# Patient Record
Sex: Female | Born: 1990 | Race: Black or African American | Hispanic: No | Marital: Single | Smoking: Never smoker
Health system: Southern US, Community
[De-identification: ages and names within clinical notes are randomized; demographics above are authoritative.]

## PROBLEM LIST (undated history)

## (undated) DIAGNOSIS — R569 Unspecified convulsions: Secondary | ICD-10-CM

## (undated) DIAGNOSIS — E785 Hyperlipidemia, unspecified: Secondary | ICD-10-CM

## (undated) DIAGNOSIS — E7800 Pure hypercholesterolemia, unspecified: Secondary | ICD-10-CM

## (undated) HISTORY — PX: WISDOM TOOTH EXTRACTION: SHX21

## (undated) HISTORY — DX: Unspecified convulsions: R56.9

## (undated) HISTORY — DX: Hyperlipidemia, unspecified: E78.5

## (undated) HISTORY — DX: Pure hypercholesterolemia, unspecified: E78.00

---

## 2013-04-20 ENCOUNTER — Inpatient Hospital Stay: Payer: Self-pay | Admitting: Certified Nurse Midwife

## 2013-04-21 LAB — CBC WITH DIFFERENTIAL/PLATELET
BASOS PCT: 0.3 %
Basophil #: 0.1 10*3/uL (ref 0.0–0.1)
Eosinophil #: 0 10*3/uL (ref 0.0–0.7)
Eosinophil %: 0 %
HCT: 38.9 % (ref 35.0–47.0)
HGB: 12.7 g/dL (ref 12.0–16.0)
LYMPHS PCT: 2.6 %
Lymphocyte #: 0.8 10*3/uL — ABNORMAL LOW (ref 1.0–3.6)
MCH: 28.3 pg (ref 26.0–34.0)
MCHC: 32.7 g/dL (ref 32.0–36.0)
MCV: 87 fL (ref 80–100)
Monocyte #: 1.6 x10 3/mm — ABNORMAL HIGH (ref 0.2–0.9)
Monocyte %: 5 %
Neutrophil #: 29.3 10*3/uL — ABNORMAL HIGH (ref 1.4–6.5)
Neutrophil %: 92.1 %
Platelet: 272 10*3/uL (ref 150–440)
RBC: 4.5 10*6/uL (ref 3.80–5.20)
RDW: 14 % (ref 11.5–14.5)
WBC: 31.8 10*3/uL — ABNORMAL HIGH (ref 3.6–11.0)

## 2013-04-21 LAB — GC/CHLAMYDIA PROBE AMP

## 2013-04-22 LAB — HEMATOCRIT: HCT: 32.4 % — AB (ref 35.0–47.0)

## 2013-11-23 DIAGNOSIS — M419 Scoliosis, unspecified: Secondary | ICD-10-CM | POA: Insufficient documentation

## 2013-12-24 ENCOUNTER — Emergency Department: Payer: Self-pay | Admitting: Student

## 2014-06-05 NOTE — H&P (Signed)
L&D Evaluation:  History:  HPI 24 yo G1 at 7649w3d by D=11wk US derived EDC of 04/18/13 presenting with contractions.  2/80/0 today in clinic.  No LOF, no VB, +FM   Presents with contractions   Patient's Medical History No Chronic Illness   Patient's Surgical History none   Medications Pre Natal Vitamins   Allergies NKDA   Social History none   Family History Non-Contributory   ROS:  ROS All systems were reviewed.  HEENT, CNS, GI, GU, Respiratory, CV, Renal and Musculoskeletal systems were found to be normal.   Exam:  Vital Signs stable   Urine Protein not completed   General no apparent distress   Mental Status clear   Chest clear   Abdomen gravid, non-tender   Estimated Fetal Weight Average for gestational age   Fetal Position vtx   Edema no edema   Pelvic no external lesions, 3/80/0   Mebranes Intact   FHT normal rate with no decels, category I tracing   Ucx regular, q705min   Impression:  Impression early labor, vs Braxton Hicks contraction at 6155w2d   Plan:  Plan EFM/NST, monitor contractions and for cervical change   Comments 1) Labor vs braxton-hicks contractions      - morphine 10mg  IM once     - prolonged rule out  2) Fetus - category I tracing     - AFI 9.54cm on 12/26  3) PNL A negative / ABSC negative / RI / VZI / HIV neg / RPR NR / HBsAg neg / CF negative / 1st trimester screen negative / 1-hr OGTT 85 / GBS negative     - s/p rhogam at 01/17/13  4) TDAP given 02/15/13  5) Disposition - pending prolonged rule out   Electronic Signatures: Lorrene ReidStaebler, [place] Yeats M (MD)  (Signed 27-Mar-15 00:39)  Authored: L&D Evaluation   Last Updated: 27-Mar-15 00:39 by Lorrene ReidStaebler, Breannah Kratt M (MD)

## 2019-04-21 ENCOUNTER — Ambulatory Visit: Payer: Self-pay | Attending: Internal Medicine

## 2019-04-21 DIAGNOSIS — Z23 Encounter for immunization: Secondary | ICD-10-CM

## 2019-04-21 NOTE — Progress Notes (Signed)
   Covid-19 Vaccination Clinic  Name:  Gina Henry    MRN: [id number] DOB: [birth year]  04/21/2019  [name] was observed post Covid-19 immunization for 15 minutes without incident. She was provided with Vaccine Information Sheet and instruction to access the V-Safe system.   [name] was instructed to call 911 with any severe reactions post vaccine: Marland Kitchen Difficulty breathing  . Swelling of face and throat  . A fast heartbeat  . A bad rash all over body  . Dizziness and weakness   Immunizations Administered    Name Date Dose VIS Date Route   Pfizer COVID-19 Vaccine 04/21/2019  5:16 PM 0.3 mL 01/06/2019 Intramuscular   Manufacturer: [employer], [employer]   Lot: [id number]   NDC: [address]-5

## 2019-05-02 ENCOUNTER — Encounter: Payer: Self-pay | Admitting: Obstetrics and Gynecology

## 2019-05-13 ENCOUNTER — Ambulatory Visit: Payer: Self-pay | Attending: Internal Medicine

## 2019-05-13 DIAGNOSIS — Z23 Encounter for immunization: Secondary | ICD-10-CM

## 2019-05-13 NOTE — Progress Notes (Signed)
   Covid-19 Vaccination Clinic  Name:  Gina Henry    MRN: [id number] DOB: 1990/05/14  05/13/2019  Gina Henry was observed post Covid-19 immunization for 15 minutes without incident. She was provided with Vaccine Information Sheet and instruction to access the V-Safe system.   Gina Henry was instructed to call 911 with any severe reactions post vaccine: Marland Kitchen Difficulty breathing  . Swelling of face and throat  . A fast heartbeat  . A bad rash all over body  . Dizziness and weakness   Immunizations Administered    Name Date Dose VIS Date Route   Pfizer COVID-19 Vaccine 05/13/2019  3:36 PM 0.3 mL 01/06/2019 Intramuscular   Manufacturer: [employer], [employer]   Lot: [id number]   NDC: [address]-2

## 2020-07-12 DIAGNOSIS — I868 Varicose veins of other specified sites: Secondary | ICD-10-CM | POA: Insufficient documentation

## 2020-08-09 ENCOUNTER — Encounter (INDEPENDENT_AMBULATORY_CARE_PROVIDER_SITE_OTHER): Payer: Self-pay | Admitting: Nurse Practitioner

## 2020-08-09 ENCOUNTER — Ambulatory Visit (INDEPENDENT_AMBULATORY_CARE_PROVIDER_SITE_OTHER): Payer: BC Managed Care – PPO | Admitting: Nurse Practitioner

## 2020-08-09 ENCOUNTER — Other Ambulatory Visit: Payer: Self-pay

## 2020-08-09 VITALS — BP 111/75 | HR 87 | Resp 16 | Ht 66.5 in | Wt 148.6 lb

## 2020-08-09 DIAGNOSIS — I8312 Varicose veins of left lower extremity with inflammation: Secondary | ICD-10-CM

## 2020-08-09 DIAGNOSIS — I8311 Varicose veins of right lower extremity with inflammation: Secondary | ICD-10-CM

## 2020-08-19 ENCOUNTER — Encounter (INDEPENDENT_AMBULATORY_CARE_PROVIDER_SITE_OTHER): Payer: Self-pay | Admitting: Nurse Practitioner

## 2020-08-19 NOTE — Progress Notes (Signed)
Subjective:    Patient ID: Gina Henry, female    DOB: 06-13-1990, 30 y.o.   MRN: [id number] Chief Complaint  Patient presents with   New Patient (Initial Visit)    Ref Karma Greaser eval for VV in upper thigh    The patient is seen for evaluation of varicose veins with inflammation.  She is concerned due to the increasing appearance of them and she has a family history of significant varicose veins.  Varicosities occur mostly at her bilateral thighs.  There is no history of DVT, PE or superficial thrombophlebitis. There is no history of ulceration or hemorrhage. The patient endorses a significant family history of varicose veins.   The patient has not worn graduated compression in the past. At the present time the patient has not been using over-the-counter analgesics. There is no history of prior surgical intervention or sclerotherapy.      Review of Systems  Cardiovascular:  Negative for leg swelling.  All other systems reviewed and are negative.     Objective:   Physical Exam Vitals reviewed.  HENT:     Head: Normocephalic.  Cardiovascular:     Rate and Rhythm: Normal rate.     Pulses: Normal pulses.  Pulmonary:     Effort: Pulmonary effort is normal.  Skin:    General: Skin is warm and dry.  Neurological:     Mental Status: She is alert and oriented to person, place, and time.  Psychiatric:        Mood and Affect: Mood normal.        Behavior: Behavior normal.        Thought Content: Thought content normal.        Judgment: Judgment normal.    BP 111/75 (BP Location: Right Arm)   Pulse 87   Resp 16   Ht 5' 6.5" (1.689 m)   Wt 148 lb 9.6 oz (67.4 kg)   BMI 23.63 kg/m   Past Medical History:  Diagnosis Date   Hyperlipidemia     Social History   Socioeconomic History   Marital status: Single    Spouse name: Not on file   Number of children: Not on file   Years of education: Not on file   Highest education level: Not on file  Occupational History    Not on file  Tobacco Use   Smoking status: Never   Smokeless tobacco: Never  Vaping Use   Vaping Use: Never used  Substance and Sexual Activity   Alcohol use: Yes    Comment: occassionally   Drug use: Never   Sexual activity: Not on file  Other Topics Concern   Not on file  Social History Narrative   Not on file   Social Determinants of Health   Financial Resource Strain: Not on file  Food Insecurity: Not on file  Transportation Needs: Not on file  Physical Activity: Not on file  Stress: Not on file  Social Connections: Not on file  Intimate Partner Violence: Not on file    History reviewed. No pertinent surgical history.  Family History  Problem Relation Age of Onset   Varicose Veins Mother    Colon cancer Father     No Known Allergies  CBC Latest Ref Rng & Units 04/22/2013 04/21/2013  WBC 3.6 - 11.0 x10 3/mm 3 - 31.8(H)  Hemoglobin 12.0 - 16.0 g/dL - 48.1  Hematocrit 85.6 - 47.0 % 32.4(L) 38.9  Platelets 150 - 440 x10 3/mm 3 - 272  CMP  No results found for: NA, K, CL, CO2, GLUCOSE, BUN, CREATININE, CALCIUM, PROT, ALBUMIN, AST, ALT, ALKPHOS, BILITOT, GFRNONAA, GFRAA   No results found.     Assessment & Plan:   1. Varicose veins of both lower extremities with inflammation  Recommend:  The patient has large symptomatic varicose veins that are painful and associated with swelling.  I have had a long discussion with the patient regarding  varicose veins and why they cause symptoms.  Patient will begin wearing graduated compression stockings class 1 on a daily basis, beginning first thing in the morning and removing them in the evening. The patient is instructed specifically not to sleep in the stockings.    The patient  will also begin using over-the-counter analgesics such as Motrin 600 mg po TID to help control the symptoms.    In addition, behavioral modification including elevation during the day will be initiated.    An  ultrasound of the  venous system will be obtained.   Further plans will be based on the ultrasound results and whether conservative therapies are successful at eliminating the pain and swelling.     No current outpatient medications on file prior to visit.   No current facility-administered medications on file prior to visit.    There are no Patient Instructions on file for this visit. No follow-ups on file.   Georgiana Spinner, NP

## 2020-08-23 ENCOUNTER — Encounter (INDEPENDENT_AMBULATORY_CARE_PROVIDER_SITE_OTHER): Payer: BC Managed Care – PPO

## 2020-08-23 ENCOUNTER — Ambulatory Visit (INDEPENDENT_AMBULATORY_CARE_PROVIDER_SITE_OTHER): Payer: BC Managed Care – PPO | Admitting: Nurse Practitioner

## 2021-06-29 ENCOUNTER — Ambulatory Visit
Admission: EM | Admit: 2021-06-29 | Discharge: 2021-06-29 | Disposition: A | Discharge status: Home / Self Care | Payer: BC Managed Care – PPO | Attending: Emergency Medicine | Admitting: Emergency Medicine

## 2021-06-29 ENCOUNTER — Encounter: Payer: Self-pay | Admitting: Emergency Medicine

## 2021-06-29 DIAGNOSIS — J020 Streptococcal pharyngitis: Secondary | ICD-10-CM

## 2021-06-29 LAB — POCT RAPID STREP A (OFFICE): Rapid Strep A Screen: POSITIVE — AB

## 2021-06-29 MED ORDER — AMOXICILLIN 500 MG PO CAPS: 500.0000 mg | ORAL_CAPSULE | Freq: Two times a day (BID) | ORAL | 0 refills | Status: AC

## 2021-06-29 NOTE — ED Triage Notes (Signed)
Pt said sore throat x 1 day with while inflammation on tonsils. Painful to swallow.

## 2021-06-29 NOTE — Discharge Instructions (Addendum)
Take the amoxicillin as directed.  Follow up with your primary care provider if your symptoms are not improving.   ° ° °

## 2021-06-29 NOTE — ED Provider Notes (Signed)
Renal[facility] Fiddler    CSN: [id number] Arrival date & time: 06/29/21  1117      History   Chief Complaint Chief Complaint  Patient presents with   Sore Throat    HPI Gina Henry is a 31 y.o. female.  Patient presents with sore throat since yesterday.  No fever, chills, rash, difficulty swallowing, cough, shortness of breath, or other symptoms.  Treatment at home with Tylenol; last taken yesterday.  No pertinent medical history.  The history is provided by the patient.   Past Medical History:  Diagnosis Date   Hyperlipidemia     There are no problems to display for this patient.   History reviewed. No pertinent surgical history.  OB History   No obstetric history on file.      Home Medications    Prior to Admission medications   Medication Sig Start Date End Date Taking? Authorizing Provider  amoxicillin (AMOXIL) 500 MG capsule Take 1 capsule (500 mg total) by mouth 2 (two) times daily for 10 days. 06/29/21 07/09/21 Yes Mickie Bail, NP    Family History Family History  Problem Relation Age of Onset   Varicose Veins Mother    Colon cancer Father     Social History Social History   Tobacco Use   Smoking status: Never   Smokeless tobacco: Never  Vaping Use   Vaping Use: Never used  Substance Use Topics   Alcohol use: Yes    Comment: occassionally   Drug use: Never     Allergies   Patient has no known allergies.   Review of Systems Review of Systems  Constitutional:  Negative for chills and fever.  HENT:  Positive for sore throat. Negative for ear pain and trouble swallowing.   Respiratory:  Negative for cough and shortness of breath.   Gastrointestinal:  Negative for diarrhea and vomiting.  Skin:  Negative for color change and rash.  All other systems reviewed and are negative.   Physical Exam Triage Vital Signs ED Triage Vitals  Enc Vitals Group     BP      Pulse      Resp      Temp      Temp src      SpO2      Weight       Height      Head Circumference      Peak Flow      Pain Score      Pain Loc      Pain Edu?      Excl. in GC?    No data found.  Updated Vital Signs BP 101/69 (BP Location: Right Arm)   Pulse 90   Temp 99.2 F (37.3 C) (Oral)   Resp 16   LMP 06/12/2021   SpO2 96%   Visual Acuity Right Eye Distance:   Left Eye Distance:   Bilateral Distance:    Right Eye Near:   Left Eye Near:    Bilateral Near:     Physical Exam Vitals and nursing note reviewed.  Constitutional:      General: She is not in acute distress.    Appearance: Normal appearance. She is well-developed. She is not ill-appearing.  HENT:     Right Ear: Tympanic membrane normal.     Left Ear: Tympanic membrane normal.     Nose: Nose normal.     Mouth/Throat:     Mouth: Mucous membranes are moist.  Pharynx: Posterior oropharyngeal erythema present.  Cardiovascular:     Rate and Rhythm: Normal rate and regular rhythm.     Heart sounds: Normal heart sounds.  Pulmonary:     Effort: Pulmonary effort is normal. No respiratory distress.     Breath sounds: Normal breath sounds.  Musculoskeletal:     Cervical back: Neck supple.  Skin:    General: Skin is warm and dry.  Neurological:     Mental Status: She is alert.  Psychiatric:        Mood and Affect: Mood normal.        Behavior: Behavior normal.     UC Treatments / Results  Labs (all labs ordered are listed, but only abnormal results are displayed) Labs Reviewed  POCT RAPID STREP A (OFFICE) - Abnormal; Notable for the following components:      Result Value   Rapid Strep A Screen Positive (*)    All other components within normal limits    EKG   Radiology No results found.  Procedures Procedures (including critical care time)  Medications Ordered in UC Medications - No data to display  Initial Impression / Assessment and Plan / UC Course  I have reviewed the triage vital signs and the nursing notes.  Pertinent labs & imaging results  that were available during my care of the patient were reviewed by me and considered in my medical decision making (see chart for details).    Strep pharyngitis.  Rapid strep positive.  Treating with amoxicillin.  Discussed Tylenol or ibuprofen as needed.  Education provided on strep throat.  Instructed patient to follow up with her PCP if her symptoms are not improving.  She agrees to plan of care.    Final Clinical Impressions(s) / UC Diagnoses   Final diagnoses:  Strep pharyngitis     Discharge Instructions      Take the amoxicillin as directed.  Follow up with your primary care provider if your symptoms are not improving.        ED Prescriptions     Medication Sig Dispense Auth. Provider   amoxicillin (AMOXIL) 500 MG capsule Take 1 capsule (500 mg total) by mouth 2 (two) times daily for 10 days. 20 capsule Mickie Bail, NP      PDMP not reviewed this encounter.   Mickie Bail, NP 06/29/21 1200

## 2021-07-10 ENCOUNTER — Ambulatory Visit: Payer: Self-pay | Admitting: Nurse Practitioner

## 2021-07-10 ENCOUNTER — Encounter: Payer: Self-pay | Admitting: Nurse Practitioner

## 2021-07-10 DIAGNOSIS — Z113 Encounter for screening for infections with a predominantly sexual mode of transmission: Secondary | ICD-10-CM

## 2021-07-10 LAB — WET PREP FOR TRICH, YEAST, CLUE
Trichomonas Exam: NEGATIVE
Yeast Exam: NEGATIVE

## 2021-07-10 LAB — HM HIV SCREENING LAB: HM HIV Screening: NEGATIVE

## 2021-07-10 NOTE — Progress Notes (Signed)
WET PREP negative. No treatment per SO. Verbalized understanding of further labwork pending.  Lethea Killings RN

## 2021-07-10 NOTE — Progress Notes (Signed)
[facility] Department  STI clinic/screening visit [address] [place] [address] [phone]  Subjective:  Gina Henry is a 31 y.o. female being seen today for an STI screening visit. The patient reports they do have symptoms.  Patient reports that they do not desire a pregnancy in the next year.   They reported they are not interested in discussing contraception today.    Patient's last menstrual period was 07/10/2021 (exact date).   Patient has the following medical conditions:  There are no problems to display for this patient.   Chief Complaint  Patient presents with   SEXUALLY TRANSMITTED DISEASE    STI screening. "I think I have a yeast infection."     HPI  Patient reports to clinic today for STD screening.  Patient states that she recently completed Amoxicillin on yesterday 07/09/21 for a Strep throat.    Last HIV test per patient/review of record was: Unsure  Patient reports last pap was: One year ago   Screening for MPX risk: Does the patient have an unexplained rash? No Is the patient MSM? No Does the patient endorse multiple sex partners or anonymous sex partners? No Did the patient have close or sexual contact with a person diagnosed with MPX? No Has the patient traveled outside the [place] where MPX is endemic? No Is there a high clinical suspicion for MPX-- evidenced by one of the following No  -Unlikely to be chickenpox  -Lymphadenopathy  -Rash that present in same phase of evolution on any given body part See flowsheet for further details and programmatic requirements.   Immunization history:  Immunization History  Administered Date(s) Administered   Hepatitis A 02/23/2007, 03/30/2008   Hepatitis B [birth year], 1991, 12/29/1991, 10/23/1992   Hpv-Unspecified 02/23/2007, 04/26/2007, 08/24/2007   PFIZER(Purple Top)SARS-COV-2 Vaccination 04/21/2019, 05/13/2019   Tdap 05/02/2009     The following portions of the patient's history were  reviewed and updated as appropriate: allergies, current medications, past medical history, past social history, past surgical history and problem list.  Objective:  There were no vitals filed for this visit.  Physical Exam Constitutional:      Appearance: Normal appearance.  HENT:     Head: Normocephalic. No abrasion, masses or laceration. Hair is normal.     Mouth/Throat:     Lips: Pink.     Mouth: Mucous membranes are moist. No oral lesions.     Dentition: No dental caries.     Pharynx: No oropharyngeal exudate or posterior oropharyngeal erythema.     Tonsils: No tonsillar exudate or tonsillar abscesses.  Eyes:     General: Lids are normal.        Right eye: No discharge.        Left eye: No discharge.     Conjunctiva/sclera: Conjunctivae normal.     Right eye: No exudate.    Left eye: No exudate. Abdominal:     General: Abdomen is flat.     Palpations: Abdomen is soft.     Tenderness: There is no abdominal tenderness. There is no rebound.  Genitourinary:    Pubic Area: No rash or pubic lice.      Labia:        Right: No rash, tenderness, lesion or injury.        Left: No rash, tenderness, lesion or injury.      Vagina: Normal. No vaginal discharge, erythema or lesions.     Cervix: No cervical motion tenderness, discharge, lesion or erythema.  Uterus: Not enlarged and not tender.      Rectum: External hemorrhoid present.     Comments: Small superficial tear/laceration to rectum.    Amount Discharge: small  Odor: No pH: greater than 4.5 Adheres to vaginal wall: No Color: clear Musculoskeletal:     Cervical back: Full passive range of motion without pain, normal range of motion and neck supple.  Lymphadenopathy:     Cervical: No cervical adenopathy.     Right cervical: No superficial, deep or posterior cervical adenopathy.    Left cervical: No superficial, deep or posterior cervical adenopathy.     Upper Body:     Right upper body: No supraclavicular, axillary or  epitrochlear adenopathy.     Left upper body: No supraclavicular, axillary or epitrochlear adenopathy.     Lower Body: No right inguinal adenopathy. No left inguinal adenopathy.  Skin:    General: Skin is warm and dry.     Findings: No lesion or rash.  Neurological:     Mental Status: She is alert and oriented to person, place, and time.  Psychiatric:        Attention and Perception: Attention normal.        Mood and Affect: Mood normal.        Speech: Speech normal.        Behavior: Behavior is cooperative.      Assessment and Plan:  Gina Henry is a 31 y.o. female presenting to the [facility] Department for STI screening  1. Screening examination for venereal disease -31 year old female in clinic today for STD screening. -Patient accepted all screenings including oral, vaginal CT/GC, wet prep, and bloodwork for HIV/RPR.  Patient meets criteria for HepB screening? Yes. Ordered? No - refused Patient meets criteria for HepC screening? Yes. Ordered? No - refused  Treat wet prep per standing order Discussed time line for [facility] results and that patient will be called with positive results and encouraged patient to call if she had not heard in 2 weeks.  Counseled to return or seek care for continued or worsening symptoms Recommended condom use with all sex  Patient is currently not using  contraception  to prevent pregnancy.    - HIV [facility] - Syphilis Serology, [place] - Chlamydia/Gonorrhea [facility] - Chlamydia/Gonorrhea [facility] - WET PREP FOR TRICH, YEAST, CLUE     Return if symptoms worsen or fail to improve.    Glenna Fellows, FNP

## 2021-07-11 ENCOUNTER — Emergency Department (HOSPITAL_COMMUNITY): Payer: BC Managed Care – PPO

## 2021-07-11 ENCOUNTER — Emergency Department (HOSPITAL_COMMUNITY)
Admission: EM | Admit: 2021-07-11 | Discharge: 2021-07-11 | Disposition: A | Discharge status: Home / Self Care | Payer: BC Managed Care – PPO | Attending: Emergency Medicine | Admitting: Emergency Medicine

## 2021-07-11 ENCOUNTER — Other Ambulatory Visit: Payer: Self-pay

## 2021-07-11 ENCOUNTER — Encounter (HOSPITAL_COMMUNITY): Payer: Self-pay

## 2021-07-11 DIAGNOSIS — D72829 Elevated white blood cell count, unspecified: Secondary | ICD-10-CM | POA: Diagnosis not present

## 2021-07-11 DIAGNOSIS — Z23 Encounter for immunization: Secondary | ICD-10-CM | POA: Diagnosis not present

## 2021-07-11 DIAGNOSIS — R569 Unspecified convulsions: Secondary | ICD-10-CM

## 2021-07-11 DIAGNOSIS — X58XXXA Exposure to other specified factors, initial encounter: Secondary | ICD-10-CM | POA: Insufficient documentation

## 2021-07-11 DIAGNOSIS — S90811A Abrasion, right foot, initial encounter: Secondary | ICD-10-CM | POA: Insufficient documentation

## 2021-07-11 DIAGNOSIS — E876 Hypokalemia: Secondary | ICD-10-CM | POA: Insufficient documentation

## 2021-07-11 LAB — BASIC METABOLIC PANEL
Anion gap: 11 (ref 5–15)
BUN: 10 mg/dL (ref 6–20)
CO2: 17 mmol/L — ABNORMAL LOW (ref 22–32)
Calcium: 8.6 mg/dL — ABNORMAL LOW (ref 8.9–10.3)
Chloride: 107 mmol/L (ref 98–111)
Creatinine, Ser: 1.05 mg/dL — ABNORMAL HIGH (ref 0.44–1.00)
GFR, Estimated: >60 mL/min (ref >60)
Glucose, Bld: 99 mg/dL (ref 70–99)
Potassium: 3.4 mmol/L — ABNORMAL LOW (ref 3.5–5.1)
Sodium: 135 mmol/L (ref 135–145)

## 2021-07-11 LAB — CBC
HCT: 34 % — ABNORMAL LOW (ref 36.0–46.0)
Hemoglobin: 10.8 g/dL — ABNORMAL LOW (ref 12.0–15.0)
MCH: 27.8 pg (ref 26.0–34.0)
MCHC: 31.8 g/dL (ref 30.0–36.0)
MCV: 87.4 fL (ref 80.0–100.0)
Platelets: 430 10*3/uL — ABNORMAL HIGH (ref 150–400)
RBC: 3.89 MIL/uL (ref 3.87–5.11)
RDW: 14 % (ref 11.5–15.5)
WBC: 17.1 10*3/uL — ABNORMAL HIGH (ref 4.0–10.5)
nRBC: 0 % (ref 0.0–0.2)

## 2021-07-11 LAB — URINALYSIS, ROUTINE W REFLEX MICROSCOPIC
Bacteria, UA: NONE SEEN
Bilirubin Urine: NEGATIVE
Glucose, UA: NEGATIVE mg/dL
Ketones, ur: NEGATIVE mg/dL
Leukocytes,Ua: NEGATIVE
Nitrite: NEGATIVE
Protein, ur: NEGATIVE mg/dL
Specific Gravity, Urine: 1.006 (ref 1.005–1.030)
pH: 5 (ref 5.0–8.0)

## 2021-07-11 LAB — RAPID URINE DRUG SCREEN, HOSP PERFORMED
Amphetamines: NOT DETECTED
Barbiturates: NOT DETECTED
Benzodiazepines: NOT DETECTED
Cocaine: NOT DETECTED
Opiates: NOT DETECTED
Tetrahydrocannabinol: NOT DETECTED

## 2021-07-11 LAB — I-STAT BETA HCG BLOOD, ED (MC, WL, AP ONLY): I-stat hCG, quantitative: <5 m[IU]/mL (ref <5)

## 2021-07-11 MED ORDER — BACITRACIN ZINC 500 UNIT/GM EX OINT
TOPICAL_OINTMENT | Freq: Two times a day (BID) | CUTANEOUS | Status: DC
  Administered 2021-07-11: 1 via TOPICAL
  Filled 2021-07-11: qty 0.9

## 2021-07-11 MED ORDER — ACETAMINOPHEN 500 MG PO TABS
1000.0000 mg | ORAL_TABLET | ORAL | Status: AC
  Administered 2021-07-11: 1000 mg via ORAL
  Filled 2021-07-11: qty 2

## 2021-07-11 MED ORDER — SODIUM CHLORIDE 0.9 % IV BOLUS
1000.0000 mL | Freq: Once | INTRAVENOUS | Status: AC
Start: 2021-07-11 — End: 2021-07-11
  Administered 2021-07-11: 1000 mL via INTRAVENOUS

## 2021-07-11 MED ORDER — TETANUS-DIPHTH-ACELL PERTUSSIS 5-2.5-18.5 LF-MCG/0.5 IM SUSY
0.5000 mL | PREFILLED_SYRINGE | Freq: Once | INTRAMUSCULAR | Status: AC
  Administered 2021-07-11: 0.5 mL via INTRAMUSCULAR
  Filled 2021-07-11: qty 0.5

## 2021-07-11 MED ORDER — POTASSIUM CHLORIDE CRYS ER 20 MEQ PO TBCR
40.0000 meq | EXTENDED_RELEASE_TABLET | Freq: Once | ORAL | Status: AC
  Administered 2021-07-11: 40 meq via ORAL
  Filled 2021-07-11: qty 2

## 2021-07-11 NOTE — Discharge Instructions (Signed)
I have given you the information for 2 neurology clinics you can call Monday to make an appointment with 1 of these.  Please return immediately to the emergency room should he experience another episode of the symptoms yet today.  As we discussed your symptoms may represent a seizure episode.  Other possibilities include an episode of syncope which is a medical term for passing out these can present similarly.  I recommend drinking plenty of water, eating regular meals, doing what you can to relieve stress and making sure that you are getting regular sleep.

## 2021-07-11 NOTE — ED Triage Notes (Addendum)
Pt BIB [facility] from her car after having a seizure. Family reports to EMS the patient had seizure like convulsions for about a minute. No prior hx. EMS reports patient post-ictal initially, GCS 14, and improved en route. Patient observed to have injured toes during episode. Pt received NS.

## 2021-07-11 NOTE — ED Provider Notes (Signed)
[facility] EMERGENCY DEPARTMENT Provider Note   CSN: [id number] Arrival date & time: 07/11/21  1836     History  Chief Complaint  Patient presents with   Seizures    Gina Henry is a 31 y.o. female.   Seizures Patient is a 31 year old female with no pertinent past medical history apart from some borderline high cholesterol  She is presented to the emergency room today with complaints of possible seizure that occurred approximately 1 hour ago.  Her husband was with her during the entire event.  She states that she was somewhat hungry and tired and had a headache that was achy circumferential although there was some pain right at the crown of her skull and she states she got into the car was pulling up and navigation to a restaurant when--according to her husband--she stared off to the right as if she was spaced out and then began convulsing.  She has no history of seizures.  No history of alcoholism she states that she very rarely drinks no more than once per week.  No recreational drug use.  She denies any fevers, cough, urinary frequency urgency dysuria hematuria.  She denies any abdominal pain chest pain or difficulty breathing.  She denies taking any medications.  She is currently on her menstrual cycle but states that she has not felt lightheaded or short of breath even if she is actively experiencing some bleeding.     Home Medications Prior to Admission medications   Not on File      Allergies    Patient has no known allergies.    Review of Systems   Review of Systems  Neurological:  Positive for seizures.    Physical Exam Updated Vital Signs BP 105/64 (BP Location: Right Arm)   Pulse (!) 102   Temp 98.3 F (36.8 C) (Oral)   Resp 20   LMP 07/10/2021 (Exact Date)   SpO2 99%  Physical Exam Vitals and nursing note reviewed.  Constitutional:      General: She is not in acute distress.    Comments: Pleasant well-appearing 31 year old.  In  no acute distress.  Sitting comfortably in bed.  Able answer questions appropriately follow commands. No increased work of breathing. Speaking in full sentences.   HENT:     Head: Normocephalic and atraumatic.     Nose: Nose normal.     Mouth/Throat:     Comments: Dry oral mucosa.  No tongue lacerations or bleeding Eyes:     General: No scleral icterus. Cardiovascular:     Rate and Rhythm: Normal rate and regular rhythm.     Pulses: Normal pulses.     Heart sounds: Normal heart sounds.  Pulmonary:     Effort: Pulmonary effort is normal. No respiratory distress.     Breath sounds: No wheezing.  Abdominal:     Palpations: Abdomen is soft.     Tenderness: There is no abdominal tenderness. There is no guarding or rebound.  Musculoskeletal:     Cervical back: Normal range of motion.     Right lower leg: No edema.     Left lower leg: No edema.  Skin:    General: Skin is warm and dry.     Capillary Refill: Capillary refill takes less than 2 seconds.     Comments: Superficial abrasion to toes of right foot.  No bruising or deformity.  Neurological:     Mental Status: She is alert. Mental status is at baseline.  Comments: Alert and oriented to self, place, time and event.   Speech is fluent, clear without dysarthria or dysphasia.   Strength 5/5 in upper/lower extremities   Sensation intact in upper/lower extremities   Normal gait.  CN I not tested  CN II grossly intact visual fields bilaterally. Did not visualize posterior eye.  CN III, IV, VI PERRLA and EOMs intact bilaterally  CN V Intact sensation to sharp and light touch to the face  CN VII facial movements symmetric  CN VIII not tested  CN IX, X no uvula deviation, symmetric rise of soft palate  CN XI 5/5 SCM and trapezius strength bilaterally  CN XII Midline tongue protrusion, symmetric L/R movements   Psychiatric:        Mood and Affect: Mood normal.        Behavior: Behavior normal.     ED Results / Procedures /  Treatments   Labs (all labs ordered are listed, but only abnormal results are displayed) Labs Reviewed  BASIC METABOLIC PANEL - Abnormal; Notable for the following components:      Result Value   Potassium 3.4 (*)    CO2 17 (*)    Creatinine, Ser 1.05 (*)    Calcium 8.6 (*)    All other components within normal limits  CBC - Abnormal; Notable for the following components:   WBC 17.1 (*)    Hemoglobin 10.8 (*)    HCT 34.0 (*)    Platelets 430 (*)    All other components within normal limits  URINALYSIS, ROUTINE W REFLEX MICROSCOPIC - Abnormal; Notable for the following components:   Color, Urine COLORLESS (*)    Hgb urine dipstick SMALL (*)    All other components within normal limits  RAPID URINE DRUG SCREEN, HOSP PERFORMED  I-STAT BETA HCG BLOOD, ED ([facility], [facility], [facility] ONLY)  CBG MONITORING, ED    EKG EKG Interpretation  Date/Time:  Friday July 11 2021 18:58:47 EDT Ventricular Rate:  114 PR Interval:  188 QRS Duration: 78 QT Interval:  314 QTC Calculation: 433 R Axis:   108 Text Interpretation: Right and left arm electrode reversal, interpretation assumes no reversal Sinus tachycardia Consider right ventricular hypertrophy Nonspecific T abnormalities, lateral leads No old tracing to compare Confirmed by Linwood Dibbles [phone]) on 07/11/2021 7:03:39 PM  Radiology CT HEAD WO CONTRAST ( )  Result Date: 07/11/2021 CLINICAL DATA:  New onset seizures. EXAM: CT HEAD WITHOUT CONTRAST TECHNIQUE: Contiguous axial images were obtained from the base of the skull through the vertex without intravenous contrast. RADIATION DOSE REDUCTION: This exam was performed according to the departmental dose-optimization program which includes automated exposure control, adjustment of the mA and/or kV according to patient size and/or use of iterative reconstruction technique. COMPARISON:  None Available. FINDINGS: Brain: No evidence of acute infarction, hemorrhage, hydrocephalus, extra-axial collection or mass  lesion/mass effect. Vascular: No hyperdense vessel or unexpected calcification. Skull: Normal. Negative for fracture or focal lesion. Sinuses/Orbits: No acute finding. Other: None. IMPRESSION: No acute intracranial abnormalities. Electronically Signed   By: Burman Nieves M.D.   On: 07/11/2021 21:32   DG Foot Complete Right  Result Date: 07/11/2021 CLINICAL DATA:  Toes with abrasions and tender to palpation after seizure. EXAM: RIGHT FOOT COMPLETE - 3+ VIEW COMPARISON:  None Available. FINDINGS: There is no evidence of fracture or dislocation. Suggestion of pes planus on these nonweightbearing views. There is no evidence of arthropathy or other focal bone abnormality. Soft tissues are unremarkable. IMPRESSION: No fracture or dislocation  of the right foot. Electronically Signed   By: Keith Rake M.D.   On: 07/11/2021 21:14    Procedures Procedures    Medications Ordered in ED Medications  Tdap (BOOSTRIX) injection 0.5 mL (has no administration in time range)  potassium chloride SA (KLOR-CON M) CR tablet 40 mEq (has no administration in time range)  bacitracin ointment (has no administration in time range)  sodium chloride 0.9 % bolus 1,000 mL (0 mLs Intravenous Stopped 07/11/21 2150)  acetaminophen (TYLENOL) tablet 1,000 mg (1,000 mg Oral Given 07/11/21 1942)    ED Course/ Medical Decision Making/ A&P Clinical Course as of 07/11/21 2207  Fri Jul 11, 2021  2126 X-ray of foot unremarkable I personally viewed these images agree with radiology read [WF]  2147 IMPRESSION: No acute intracranial abnormalities.   Electronically Signed   By: Lucienne Capers M.D.   On: 07/11/2021 21:32   [WF]  2147 I personally viewed CT head without contrast and do not appreciate any acute abnormalities.  Radiology read confirms no acute abnormalities. [WF]    Clinical Course User Index [WF] Tedd Sias, [place]                           Medical Decision Making Amount and/or Complexity of Data  Reviewed Labs: ordered. Radiology: ordered.  Risk OTC drugs. Prescription drug management.  This patient presents to the ED for concern of seizure, this involves a number of treatment options, and is a complaint that carries with it a moderate to high risk of complications and morbidity.  The differential diagnosis includes The differential for seizures includes vascular issue such as AV malformations, stroke, encephalitis meningitis, Lyme disease, brain abscess, HIV, trauma, autoimmune conditions including SLE, vasculitis, hyponatremia other metabolic dyscrasias such as uremia or electrolyte abnormality, porphyria or hepatic encephalopathy, primary epilepsy, brain tumor, and specific syndromes (including Tuberous Sclerosis, Down's syndrome, Sturge Weber syndrome Von Hippel Lindau syndrome)  Also worth considering our intoxication, recreational drug use, alcohol intoxication or withdrawal, medication noncompliance.  Wellbutrin, diltiazem, verapamil, lidocaine, cephalosporins, tricyclic antidepressants, certain antineoplastics, lithium, fentanyl, tramadol, cocaine, Benadryl, Sudafed  Sleep deprivation, caffeine withdrawal, anxiety, stress, dehydration and other physiologic stressors can also lower seizure threshold.    Co morbidities: Discussed in HPI   Brief History:  Patient is a 31 year old female with no pertinent past medical history apart from some borderline high cholesterol  She is presented to the emergency room today with complaints of possible seizure that occurred approximately 1 hour ago.  Her husband was with her during the entire event.  She states that she was somewhat hungry and tired and had a headache that was achy circumferential although there was some pain right at the crown of her skull and she states she got into the car was pulling up and navigation to a restaurant when--according to her husband--she stared off to the right as if she was spaced out and then began  convulsing.  She has no history of seizures.  No history of alcoholism she states that she very rarely drinks no more than once per week.  No recreational drug use.  She denies any fevers, cough, urinary frequency urgency dysuria hematuria.  She denies any abdominal pain chest pain or difficulty breathing.  She denies taking any medications.  She is currently on her menstrual cycle but states that she has not felt lightheaded or short of breath even if she is actively experiencing some bleeding.   Physical  exam unremarkable apart from some abrasions to the top of the toes of the right foot.   EMR reviewed including pt PMHx, past surgical history and past visits to ER.   See HPI for more details   Lab Tests:   I ordered and independently interpreted labs. Labs notable for anemia of 10.8.  She has not had any lightheadedness or shortness of breath.  I doubt that this is a acute finding for her. Her last labs that I am able to view her 8 years ago. Mild hypokalemia.  We will give 1 dose of potassium here she will need to have it rechecked with PCP.  Doubt that this is related at all to her presentation today.  CBC with leukocytosis consistent with seizure.  UDS negative.  I-STAT hCG negative pregnancy.  Urinalysis unremarkable  Imaging Studies:  NAD. I personally reviewed all imaging studies and no acute abnormality found. I agree with radiology interpretation. Right foot without fractures of the toes.  CT head unremarkable no mass lesions.   Cardiac Monitoring:  The patient was maintained on a cardiac monitor.  I personally viewed and interpreted the cardiac monitored which showed an underlying rhythm of: Sinus tachycardia EKG non-ischemic   Medicines ordered:  I ordered medication including 1 L normal saline, Tylenol 1000 mg for pain Reevaluation of the patient after these medicines showed that the patient improved I have reviewed the patients home medicines and have made  adjustments as needed   Critical Interventions:     Consults/Attending Physician      Reevaluation:  After the interventions noted above I re-evaluated patient and found that they have :improved   Social Determinants of Health:      Problem List / ED Course:  Most likely this is represents a seizure.  This is her first seizure.  No history of alcohol use.  And her tachycardia is resolving with some fluids.  She is at her mental baseline and was on arrival to the ER.  Seizure precautions given, she understands that she cannot drive or operate heavy machinery or swim until she is seen by neurology. Anemia perhaps chronic.  No recent labs to compare to.  She does not have any symptoms of lightheadedness or shortness of breath.  She will need to have this rechecked with PCP expediently.   Dispostion:  After consideration of the diagnostic results and the patients response to treatment, I feel that the patent would benefit from close outpatient follow-up.  Return precautions discussed   Final Clinical Impression(s) / ED Diagnoses Final diagnoses:  Seizure-like activity [facility])    Rx / [place] Orders ED Discharge Orders     None         Tedd Sias, [place] 07/11/21 2212    Dorie Rank, MD 07/12/21 1515

## 2021-07-11 NOTE — ED Notes (Signed)
Patient is complaining of pain and immobility to her right foot over her toes. She is requesting an x-ray of her foot.

## 2021-07-14 ENCOUNTER — Encounter: Payer: Self-pay | Admitting: Neurology

## 2021-07-15 ENCOUNTER — Ambulatory Visit: Payer: BC Managed Care – PPO | Admitting: Neurology

## 2021-07-15 ENCOUNTER — Encounter: Payer: Self-pay | Admitting: Neurology

## 2021-07-15 VITALS — BP 107/73 | HR 73 | Ht 66.0 in | Wt 157.0 lb

## 2021-07-15 DIAGNOSIS — R569 Unspecified convulsions: Secondary | ICD-10-CM | POA: Diagnosis not present
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
## 2021-07-16 ENCOUNTER — Telehealth: Payer: Self-pay | Admitting: Neurology

## 2021-07-16 NOTE — Telephone Encounter (Signed)
Joni Reining from [facility] called to request to clarify an order.  She states the referral for prior authorization on file is for a CT scan and supposed to be for a MRI.

## 2021-07-17 NOTE — Telephone Encounter (Signed)
Patient wants to know whether the MRI or the EEG is more important at this time (if she can only afford to have one of the tests)?

## 2021-07-17 NOTE — Telephone Encounter (Signed)
Called back and made her aware that in had been fixed and it is the MRI. She asked if she would have to pay the day of the MRI, I told her to call insurance to see what it covers and what she would have to pay. I also told her she would probably be reasonable for the amount if any up front .

## 2021-07-21 ENCOUNTER — Encounter: Payer: Self-pay | Admitting: Neurology

## 2021-07-21 ENCOUNTER — Ambulatory Visit (HOSPITAL_COMMUNITY)
Admission: RE | Admit: 2021-07-21 | Discharge: 2021-07-21 | Disposition: A | Discharge status: Home / Self Care | Payer: BC Managed Care – PPO | Source: Ambulatory Visit | Attending: Neurology | Admitting: Neurology

## 2021-07-21 DIAGNOSIS — R569 Unspecified convulsions: Secondary | ICD-10-CM | POA: Diagnosis present

## 2021-07-21 NOTE — Progress Notes (Signed)
Outpatient EEG complete - results pending.  

## 2021-07-23 NOTE — Procedures (Signed)
ELECTROENCEPHALOGRAM REPORT  Date of Study: 07/21/2021  Patient's Name: Gina Henry MRN: [id number] Date of Birth: Mar 15, 1990  Referring Provider: Dr. Patrcia Dolly  Clinical History: This is a 31 year old woman with new onset seizure. EEG for classification.  Medications: none  Technical Summary: A multichannel digital EEG recording measured by the international 10-20 system with electrodes applied with paste and impedances below 5000 ohms performed in our laboratory with EKG monitoring in an awake patient.  Hyperventilation and photic stimulation were performed.  The digital EEG was referentially recorded, reformatted, and digitally filtered in a variety of bipolar and referential montages for optimal display.    Description: The patient is awake during the recording.  During maximal wakefulness, there is a symmetric, medium voltage 10 Hz posterior dominant rhythm that attenuates with eye opening.  The record is symmetric.  Sleep was not captured. Hyperventilation and photic stimulation did not elicit any abnormalities.  There were no epileptiform discharges or electrographic seizures seen.    EKG lead was unremarkable.  Impression: This awake EEG is normal.    Clinical Correlation: A normal EEG does not exclude a clinical diagnosis of epilepsy.  If further clinical questions remain, prolonged EEG may be helpful.  Clinical correlation is advised.   Patrcia Dolly, M.D.

## 2021-07-24 NOTE — Telephone Encounter (Signed)
Pt called in and left a message wanting to get her EEG results

## 2021-07-25 ENCOUNTER — Ambulatory Visit (LOCAL_COMMUNITY_HEALTH_CENTER): Payer: Self-pay

## 2021-07-25 DIAGNOSIS — Z111 Encounter for screening for respiratory tuberculosis: Secondary | ICD-10-CM

## 2021-07-28 ENCOUNTER — Ambulatory Visit (LOCAL_COMMUNITY_HEALTH_CENTER): Payer: Self-pay

## 2021-07-28 DIAGNOSIS — Z111 Encounter for screening for respiratory tuberculosis: Secondary | ICD-10-CM

## 2021-07-28 LAB — TB SKIN TEST
Induration: 0 mm
TB Skin Test: NEGATIVE

## 2021-08-01 ENCOUNTER — Ambulatory Visit: Payer: BC Managed Care – PPO | Admitting: Neurology

## 2021-09-16 ENCOUNTER — Ambulatory Visit: Payer: 59 | Admitting: Advanced Practice Midwife

## 2021-09-16 ENCOUNTER — Encounter: Payer: Self-pay | Admitting: Advanced Practice Midwife

## 2021-09-16 VITALS — BP 100/60 | Ht 66.0 in | Wt 158.0 lb

## 2021-09-16 DIAGNOSIS — O219 Vomiting of pregnancy, unspecified: Secondary | ICD-10-CM

## 2021-09-16 DIAGNOSIS — Z3200 Encounter for pregnancy test, result unknown: Secondary | ICD-10-CM

## 2021-09-16 LAB — POCT URINE PREGNANCY: Preg Test, Ur: POSITIVE — AB

## 2021-09-16 MED ORDER — ONDANSETRON 4 MG PO TBDP: 4.0000 mg | ORAL_TABLET | Freq: Four times a day (QID) | ORAL | 0 refills | Status: DC | PRN

## 2021-09-16 MED ORDER — BONJESTA 20-20 MG PO TBCR: 1.0000 | EXTENDED_RELEASE_TABLET | Freq: Two times a day (BID) | ORAL | 2 refills | Status: DC | PRN

## 2021-09-16 NOTE — Progress Notes (Signed)
Patient ID: Gina Henry, female   DOB: December 16, 1990, 31 y.o.   MRN: [id number]  Reason for Consult: Possible Pregnancy   Subjective:  HPI:  Gina Henry is a 31 y.o. female being seen for confirmation of pregnancy. She reports nausea and vomiting and requests Rx. Comfort measures and safe medications reviewed. She has a known last period of 7/12 and has not yet had a dating scan. By LMP she is 5 weeks and 6 days with EDD of 05/13/21.  Past Medical History:  Diagnosis Date   High cholesterol    Hyperlipidemia    Seizure (HCC)    Family History  Problem Relation Age of Onset   Varicose Veins Mother    Colon cancer Father    High blood pressure Maternal Aunt    No past surgical history on file.  Short Social History:  Social History   Tobacco Use   Smoking status: Never   Smokeless tobacco: Never  Substance Use Topics   Alcohol use: Yes    Comment: occassionally    No Known Allergies  Current Outpatient Medications  Medication Sig Dispense Refill   Doxylamine-Pyridoxine ER (BONJESTA) 20-20 MG TBCR Take 1 tablet by mouth 2 (two) times daily as needed. 60 tablet 2   ondansetron (ZOFRAN-ODT) 4 MG disintegrating tablet Take 1 tablet (4 mg total) by mouth every 6 (six) hours as needed for nausea. 20 tablet 0   No current facility-administered medications for this visit.    Review of Systems  Constitutional:  Negative for chills and fever.  HENT:  Negative for congestion, ear discharge, ear pain, hearing loss, sinus pain and sore throat.   Eyes:  Negative for blurred vision and double vision.  Respiratory:  Negative for cough, shortness of breath and wheezing.   Cardiovascular:  Negative for chest pain, palpitations and leg swelling.  Gastrointestinal:  Positive for nausea and vomiting. Negative for abdominal pain, blood in stool, constipation, diarrhea, heartburn and melena.  Genitourinary:  Negative for dysuria, flank pain, frequency, hematuria and urgency.   Musculoskeletal:  Negative for back pain, joint pain and myalgias.  Skin:  Negative for itching and rash.  Neurological:  Negative for dizziness, tingling, tremors, sensory change, speech change, focal weakness, seizures, loss of consciousness, weakness and headaches.  Endo/Heme/Allergies:  Negative for environmental allergies. Does not bruise/bleed easily.  Psychiatric/Behavioral:  Negative for depression, hallucinations, memory loss, substance abuse and suicidal ideas. The patient is not nervous/anxious and does not have insomnia.         Objective:  Objective   Vitals:   09/16/21 0832  BP: 100/60  Weight: 158 lb (71.7 kg)  Height: 5\' 6"  (1.676 m)   Body mass index is 25.5 kg/m. Constitutional: Well nourished, well developed female in no acute distress.  HEENT: normal Skin: Warm and dry.  Cardiovascular: Regular rate and rhythm.   Extremity:  no edema   Respiratory: Clear to auscultation bilateral. Normal respiratory effort Neuro: DTRs 2+, Cranial nerves grossly intact Psych: Alert and Oriented x3. No memory deficits. Normal mood and affect.     Data:  Latest Reference Range & Units 09/16/21 08:50  Preg Test, Ur Negative  Positive !  !: Data is abnormal     Assessment/Plan:     31 y.o. G2 P80 female with positive pregnancy test, morning sickness  Rx [place] Rx Zofran in case insurance doesn't cover [place] Return to clinic for NOB intake and NOB visit   [place] CNM [facility] 09/16/2021, 2:56 PM

## 2021-09-16 NOTE — Patient Instructions (Signed)
Morning Sickness  Morning sickness is when a woman feels nauseous during pregnancy. This nauseous feeling may or may not come with vomiting. It often occurs in the morning, but it can be a problem at any time of day. Morning sickness is most common during the first trimester. In some cases, it may continue throughout pregnancy. Although morning sickness is unpleasant, it is usually harmless unless the woman develops severe and continual vomiting (hyperemesis gravidarum), a condition that requires more intense treatment. What are the causes? The exact cause of this condition is not known, but it seems to be related to normal hormonal changes that occur in pregnancy. What increases the risk? You are more likely to develop this condition if: You experienced nausea or vomiting before your pregnancy. You had morning sickness during a previous pregnancy. You are pregnant with more than one baby, such as twins. What are the signs or symptoms? Symptoms of this condition include: Nausea. Vomiting. How is this diagnosed? This condition is usually diagnosed based on your signs and symptoms. How is this treated? In many cases, treatment is not needed for this condition. Making some changes to what you eat may help to control symptoms. Your health care provider may also prescribe or recommend: Vitamin B6 supplements. Anti-nausea medicines. Ginger. Follow these instructions at home: Medicines Take over-the-counter and prescription medicines only as told by your health care provider. Do not use any prescription, over-the-counter, or herbal medicines for morning sickness without first talking with your health care provider. Take multivitamins before getting pregnant. This can prevent or decrease the severity of morning sickness in most women. Eating and drinking Eat a piece of dry toast or crackers before getting out of bed in the morning. Eat 5 or 6 small meals a day. Eat dry and bland foods, such as  rice or a baked potato. Foods that are high in carbohydrates are often helpful. Avoid greasy, fatty, and spicy foods. Have someone cook for you if the smell of any food causes nausea and vomiting. If you feel nauseous after taking prenatal vitamins, take the vitamins at night or with a snack. Eat a protein snack between meals if you are hungry. Nuts, yogurt, and cheese are good options. Drink fluids throughout the day. Try ginger ale made with real ginger, ginger tea made from fresh grated ginger, or ginger candies. General instructions Do not use any products that contain nicotine or tobacco. These products include cigarettes, chewing tobacco, and vaping devices, such as e-cigarettes. If you need help quitting, ask your health care provider. Get an air purifier to keep the air in your house free of odors. Get plenty of fresh air. Try to avoid odors that trigger your nausea. Consider trying these methods to help relieve symptoms: Wearing an acupressure wristband. These wristbands are often worn for seasickness. Acupuncture. Contact a health care provider if: Your home remedies are not working and you need medicine. You feel dizzy or light-headed. You are losing weight. Get help right away if: You have persistent and uncontrolled nausea and vomiting. You faint. You have severe pain in your abdomen. Summary Morning sickness is when a woman feels nauseous during pregnancy. This nauseous feeling may or may not come with vomiting. Morning sickness is most common during the first trimester. It often occurs in the morning, but it can be a problem at any time of day. In many cases, treatment is not needed for this condition. Making some changes to what you eat may help to control symptoms. This  information is not intended to replace advice given to you by your health care provider. Make sure you discuss any questions you have with your health care provider. Document Revised: 08/28/2019 Document  Reviewed: 08/07/2019 Elsevier Patient Education  2023 Elsevier Inc.   

## 2021-09-18 ENCOUNTER — Other Ambulatory Visit: Payer: 59

## 2021-09-22 ENCOUNTER — Ambulatory Visit: Payer: 59

## 2021-09-22 ENCOUNTER — Encounter: Payer: BC Managed Care – PPO | Admitting: Advanced Practice Midwife

## 2021-10-03 ENCOUNTER — Telehealth: Payer: Self-pay | Admitting: Neurology

## 2021-10-03 ENCOUNTER — Encounter: Payer: Self-pay | Admitting: Neurology

## 2021-10-03 ENCOUNTER — Telehealth: Payer: Self-pay

## 2021-10-03 DIAGNOSIS — R569 Unspecified convulsions: Secondary | ICD-10-CM

## 2021-10-03 HISTORY — DX: Unspecified convulsions: R56.9

## 2021-10-03 MED ORDER — LEVETIRACETAM 500 MG PO TABS: ORAL_TABLET | ORAL | 11 refills | Status: DC

## 2021-10-03 NOTE — Telephone Encounter (Signed)
Pt tx'd from front desk; states she is early preg; had a seizure this am; wants to come in and have the baby checked out.  Adv the only way to check the baby this early is with u/s and we do not have a tech today; adv pt to go to ER.

## 2021-10-03 NOTE — Telephone Encounter (Signed)
Spoke to patient. She had a seizure at 4 or 5am, in her sleep,was told she rolled on stomach, both arms behind her back and shaking a little bit. When she woke up felt really confused, knew something happened, could not even remember where she worked when she looked at her phone. Bit her tongue, body really sore. Has not been sick, sleep deprived, just the usual morning sickness. About 8 weeks into pregnancy. Discussed that at this point she needs to start medication, discussed starting Keppra 500mg  BID, side effects discussed. We discussed issues in women with epilepsy, including malformation risks on Keppra. Needs monthly Keppra levels. She will r/s appt to 1 month from now, will put on cancellation list.

## 2021-10-03 NOTE — Telephone Encounter (Signed)
[place] driving laws discussed, no driving until 6 mos seizure-free.

## 2021-10-03 NOTE — Telephone Encounter (Signed)
Patient had seizure this morning, second one ever, she is recently pregnant at about 8 weeks.  Patient called her OB and was told to call her neurologist and/or go to the ER.

## 2021-10-06 ENCOUNTER — Ambulatory Visit (INDEPENDENT_AMBULATORY_CARE_PROVIDER_SITE_OTHER): Payer: 59

## 2021-10-06 DIAGNOSIS — Z8639 Personal history of other endocrine, nutritional and metabolic disease: Secondary | ICD-10-CM

## 2021-10-06 DIAGNOSIS — Z369 Encounter for antenatal screening, unspecified: Secondary | ICD-10-CM

## 2021-10-06 DIAGNOSIS — Z3481 Encounter for supervision of other normal pregnancy, first trimester: Secondary | ICD-10-CM

## 2021-10-06 DIAGNOSIS — Z3480 Encounter for supervision of other normal pregnancy, unspecified trimester: Secondary | ICD-10-CM

## 2021-10-06 DIAGNOSIS — Z3483 Encounter for supervision of other normal pregnancy, third trimester: Secondary | ICD-10-CM | POA: Insufficient documentation

## 2021-10-06 NOTE — Progress Notes (Signed)
New OB Intake  I connected with  Gina Henry on 10/06/21 at  9:15 AM EDT by telephone and verified that I am speaking with the correct person using two identifiers. Nurse is located at [facility] and pt is located at in car at work.  I explained I am completing New OB Intake today. We discussed her EDD of 05/13/2022 that is based on LMP of 08/06/2021. Pt is G2/P1001. I reviewed her allergies, medications, Medical/Surgical/OB history, and appropriate screenings. Based on history, this is a/an pregnancy uncomplicated .   Patient Active Problem List   Diagnosis Date Noted   Varicose veins of other sites 07/12/2020   Scoliosis 11/23/2013    Concerns addressed today Pt had question regarding her pnv which was answered to her satisfaction.  Delivery Plans:  Plans to deliver at [facility].  Anatomy US Explained anatomy US will be around 20 weeks.   Labs Discussed genetic screening with patient. Patient desires genetic testing to be drawn awith new OB labs if 10 weeks or more.  Discussed possible labs to be drawn at new OB appointment.  COVID Vaccine Patient has had COVID vaccine. Has not received boosters.  Social Determinants of Health Food Insecurity: denies food insecurity Transportation: Patient denies transportation needs. Childcare: Discussed no children allowed at ultrasound appointments.   First visit review I reviewed new OB appt with pt. I explained she will have ob bloodwork and pap smear/pelvic exam if indicated. Explained pt will be seen by Dr. Nicholaus Bloom at first visit; encounter routed to appropriate provider.   Loran Senters, [facility] 10/06/2021  9:41 AM  Clinical Staff Provider  Office Location  [facility] Dating    Language  English Anatomy US    Flu Vaccine  offer Genetic Screen  NIPS:   TDaP vaccine   offer Hgb A1C or  GTT Early : Third trimester :   Covid No boosters   LAB RESULTS   Rhogam   Blood Type     Feeding Plan Formula  Antibody    Contraception Maybe the patch Rubella    Circumcision yes RPR     Pediatrician  [facility]     Support Person Domonique HIV    Prenatal Classes yes Varicella     GBS  (For PCN allergy, check sensitivities)   BTL Consent  Hep C   VBAC Consent  Pap      Hgb Electro      CF      SMA

## 2021-10-10 ENCOUNTER — Telehealth: Payer: Self-pay | Admitting: Neurology

## 2021-10-10 NOTE — Telephone Encounter (Signed)
Letter was done on 10/03/2021 after we talked (see Letters tab). She will need monthly Keppra levels during pregnancy. Will put her on cancellation list for 1 month f/u (around 11/02/21).

## 2021-10-10 NOTE — Telephone Encounter (Signed)
Patient called to cancel her 10/20/21 appt. She said she is supposed to make an appt after being on the meds. She will need her levels checked. She also stated she has not received the letter for her job. She doesn't see anything in her mychart. She mentioned she was driving even though she was not supposed to. She said she has to work.

## 2021-10-13 NOTE — Telephone Encounter (Signed)
Talked to pt letter placed up front for her to come by and pick up, she will come by next week for lab work. I will let the front know to add her to the wait list

## 2021-10-17 ENCOUNTER — Other Ambulatory Visit: Payer: 59

## 2021-10-17 DIAGNOSIS — Z369 Encounter for antenatal screening, unspecified: Secondary | ICD-10-CM

## 2021-10-17 DIAGNOSIS — Z3481 Encounter for supervision of other normal pregnancy, first trimester: Secondary | ICD-10-CM

## 2021-10-18 LAB — CBC/D/PLT+RPR+RH+ABO+RUBIGG...
Antibody Screen: NEGATIVE
Basophils Absolute: 0.1 10*3/uL (ref 0.0–0.2)
Basos: 1 %
EOS (ABSOLUTE): 0.2 10*3/uL (ref 0.0–0.4)
Eos: 1 %
HCV Ab: NONREACTIVE
HIV Screen 4th Generation wRfx: NONREACTIVE
Hematocrit: 37.5 % (ref 34.0–46.6)
Hemoglobin: 12.5 g/dL (ref 11.1–15.9)
Hepatitis B Surface Ag: NEGATIVE
Immature Grans (Abs): 0 10*3/uL (ref 0.0–0.1)
Immature Granulocytes: 0 %
Lymphocytes Absolute: 2.2 10*3/uL (ref 0.7–3.1)
Lymphs: 18 %
MCH: 28.2 pg (ref 26.6–33.0)
MCHC: 33.3 g/dL (ref 31.5–35.7)
MCV: 85 fL (ref 79–97)
Monocytes Absolute: 0.6 10*3/uL (ref 0.1–0.9)
Monocytes: 5 %
Neutrophils Absolute: 8.9 10*3/uL — ABNORMAL HIGH (ref 1.4–7.0)
Neutrophils: 75 %
Platelets: 427 10*3/uL (ref 150–450)
RBC: 4.43 x10E6/uL (ref 3.77–5.28)
RDW: 13.2 % (ref 11.7–15.4)
RPR Ser Ql: NONREACTIVE
Rh Factor: NEGATIVE
Rubella Antibodies, IGG: 1.14 index (ref >0.99)
Varicella zoster IgG: 694 index (ref >165)
WBC: 11.9 10*3/uL — ABNORMAL HIGH (ref 3.4–10.8)

## 2021-10-18 LAB — HCV INTERPRETATION

## 2021-10-20 ENCOUNTER — Encounter: Payer: Self-pay | Admitting: Obstetrics & Gynecology

## 2021-10-20 ENCOUNTER — Ambulatory Visit: Payer: BC Managed Care – PPO | Admitting: Neurology

## 2021-10-20 DIAGNOSIS — O26899 Other specified pregnancy related conditions, unspecified trimester: Secondary | ICD-10-CM | POA: Insufficient documentation

## 2021-10-21 ENCOUNTER — Encounter: Payer: Self-pay | Admitting: Obstetrics & Gynecology

## 2021-10-21 ENCOUNTER — Other Ambulatory Visit (HOSPITAL_COMMUNITY)
Admission: RE | Admit: 2021-10-21 | Discharge: 2021-10-21 | Disposition: A | Discharge status: Home / Self Care | Payer: 59 | Source: Ambulatory Visit | Attending: Obstetrics & Gynecology | Admitting: Obstetrics & Gynecology

## 2021-10-21 ENCOUNTER — Ambulatory Visit (INDEPENDENT_AMBULATORY_CARE_PROVIDER_SITE_OTHER): Payer: 59 | Admitting: Obstetrics & Gynecology

## 2021-10-21 VITALS — BP 100/70 | Wt 167.0 lb

## 2021-10-21 DIAGNOSIS — O26891 Other specified pregnancy related conditions, first trimester: Secondary | ICD-10-CM | POA: Insufficient documentation

## 2021-10-21 DIAGNOSIS — N898 Other specified noninflammatory disorders of vagina: Secondary | ICD-10-CM

## 2021-10-21 DIAGNOSIS — Z3480 Encounter for supervision of other normal pregnancy, unspecified trimester: Secondary | ICD-10-CM | POA: Diagnosis not present

## 2021-10-21 DIAGNOSIS — O26899 Other specified pregnancy related conditions, unspecified trimester: Secondary | ICD-10-CM

## 2021-10-21 DIAGNOSIS — Z3689 Encounter for other specified antenatal screening: Secondary | ICD-10-CM

## 2021-10-21 DIAGNOSIS — Z6791 Unspecified blood type, Rh negative: Secondary | ICD-10-CM

## 2021-10-21 DIAGNOSIS — R569 Unspecified convulsions: Secondary | ICD-10-CM

## 2021-10-21 NOTE — Progress Notes (Signed)
  Subjective:    Gina Henry is a 31 yo engaged G2P1 (46 yo son)  being seen today for her first obstetrical visit.  This is not a planned pregnancy. She is at [redacted]w[redacted]d gestation. Her obstetrical history is significant for a recent diagnosis of seizures (2 in the last year- normal EEG, see Dr. Delice Lesch for neurology) Relationship with FOB: engaged, living together. Pregnancy history fully reviewed.  Patient reports no complaints.  Review of Systems:   Review of Systems  Objective:     BP 100/70   Wt 167 lb (75.8 kg)   LMP 08/06/2021 (Exact Date)   BMI 26.95 kg/m  Physical Exam  Exam Well nourished, well hydrated Black female, no apparent distress She is ambulating and conversing normally. General:  alert   Breasts:  inspection negative, no nipple discharge or bleeding, no masses or nodularity palpable  Lungs: clear to auscultation bilaterally  Heart:  regular rate and rhythm, S1, S2 normal, no murmur, click, rub or gallop  Abdomen: soft, non-tender; bowel sounds normal; no masses,  no organomegaly   Vulva:  normal  Vagina: normal  Cervix:  No lesions, normal discharge  Corpus: 11 week size  Adnexa:  not enlarged or painful  Rectal Exam: Not performed.   Bedside ultrasound revealed a singleton pregnancy with a FHR of about 160.    Assessment:    Pregnancy: G2P1001 Patient Active Problem List   Diagnosis Date Noted   Seizures ([place]) 10/21/2021   Rh negative state in antepartum period 10/20/2021   Supervision of other normal pregnancy, antepartum 10/06/2021   Varicose veins of other sites 07/12/2020   Scoliosis 11/23/2013       Plan:     Initial labs drawn. Prenatal vitamins. Problem list reviewed and updated. Mat 21 already drawn Role of ultrasound in pregnancy discussed; fetal survey: ordered for MFM anatomy (due to seizures)  Follow up in 4 weeks. Per neurology, she will need keppra levels monthly during pregnancy. Pap smear done I will base treatment of  vaginal discharge on Aptima results  Muad Noga C Harmoney Sienkiewicz 10/21/2021

## 2021-10-21 NOTE — Addendum Note (Signed)
Addended by: Quintella Baton D on: 10/21/2021 03:57 PM   Modules accepted: Orders

## 2021-10-22 NOTE — Addendum Note (Signed)
Addended by: Drenda Freeze on: 10/22/2021 11:52 AM   Modules accepted: Orders

## 2021-10-23 LAB — DRUG SCREEN, URINE
Amphetamines, Urine: NEGATIVE ng/mL
Barbiturate screen, urine: NEGATIVE ng/mL
Benzodiazepine Quant, Ur: NEGATIVE ng/mL
Cannabinoid Quant, Ur: NEGATIVE ng/mL
Cocaine (Metab.): NEGATIVE ng/mL
Opiate Quant, Ur: NEGATIVE ng/mL
PCP Quant, Ur: NEGATIVE ng/mL

## 2021-10-23 LAB — URINE CULTURE

## 2021-10-24 LAB — MATERNIT 21 PLUS CORE, BLOOD
Fetal Fraction: 4
Result (T21): NEGATIVE
Trisomy 13 (Patau syndrome): NEGATIVE
Trisomy 18 (Edwards syndrome): NEGATIVE
Trisomy 21 (Down syndrome): NEGATIVE

## 2021-10-28 LAB — CYTOLOGY - PAP
Chlamydia: NEGATIVE
Comment: NEGATIVE
Comment: NORMAL
Diagnosis: NEGATIVE
Neisseria Gonorrhea: NEGATIVE

## 2021-10-30 ENCOUNTER — Telehealth: Payer: Self-pay

## 2021-10-30 NOTE — Telephone Encounter (Signed)
Patient aware she will be managed by our providers, order will be changed for her to have u/s here.

## 2021-10-30 NOTE — Telephone Encounter (Signed)
Pt returned your call, pt aware.

## 2021-10-30 NOTE — Telephone Encounter (Signed)
Left voicemail to return call. Per Dr Amalia Hailey, patient does not need to see MFM. She can be managed by our providers. She can referred if any problems arise during the course of her pregnancy.

## 2021-10-30 NOTE — Addendum Note (Signed)
Addended by: Meryl Dare on: 10/30/2021 04:51 PM   Modules accepted: Orders

## 2021-10-30 NOTE — Telephone Encounter (Signed)
Pt called triage requesting answers on her pap, possibly showing yeast? Per Joycelyn Schmid, advised the 7 day OTC Monistat and hopefully that will take care of it., f/u as needed. Pt aware.  Also, she was advised by Dr Hulan Fray that she would be referred to another site because she's considered high risk due to seizures. Has not heard from anyone about scheduling an appointment. Crystal, do you know anything?

## 2021-11-03 ENCOUNTER — Other Ambulatory Visit: Payer: Self-pay

## 2021-11-03 ENCOUNTER — Telehealth: Payer: Self-pay | Admitting: Neurology

## 2021-11-03 ENCOUNTER — Other Ambulatory Visit (INDEPENDENT_AMBULATORY_CARE_PROVIDER_SITE_OTHER): Payer: 59

## 2021-11-03 ENCOUNTER — Other Ambulatory Visit: Payer: Self-pay | Admitting: Advanced Practice Midwife

## 2021-11-03 DIAGNOSIS — R569 Unspecified convulsions: Secondary | ICD-10-CM | POA: Diagnosis not present

## 2021-11-03 DIAGNOSIS — O219 Vomiting of pregnancy, unspecified: Secondary | ICD-10-CM

## 2021-11-03 MED ORDER — ONDANSETRON 4 MG PO TBDP: 4.0000 mg | ORAL_TABLET | Freq: Four times a day (QID) | ORAL | 0 refills | Status: DC | PRN

## 2021-11-03 NOTE — Telephone Encounter (Signed)
1. Which medications need refilled? (List name and dosage, if known) Keppra  2. Which pharmacy/location is medication to be sent to? (include street and city if local pharmacy) [place]  She is out and doesn't think she has any refills. She also wasn't sure if she had to wait until after her lab tests come back?

## 2021-11-03 NOTE — Telephone Encounter (Signed)
She should have refills, I sent 11 refills with the Rx last month. Would fill that first because it takes a few days for Keppra level to come back. Thanks

## 2021-11-03 NOTE — Telephone Encounter (Signed)
Pt called advised that we sent in 11 refills last month and that when we get her lab results I will call her back

## 2021-11-07 LAB — LEVETIRACETAM LEVEL: Keppra (Levetiracetam): <2 ug/mL — ABNORMAL LOW

## 2021-11-11 ENCOUNTER — Telehealth: Payer: Self-pay | Admitting: Anesthesiology

## 2021-11-11 DIAGNOSIS — R569 Unspecified convulsions: Secondary | ICD-10-CM

## 2021-11-11 DIAGNOSIS — Z79899 Other long term (current) drug therapy: Secondary | ICD-10-CM

## 2021-11-11 MED ORDER — LEVETIRACETAM 1000 MG PO TABS: ORAL_TABLET | ORAL | 3 refills | Status: DC

## 2021-11-11 NOTE — Telephone Encounter (Signed)
Patient would like a call back with lab results. She saw results on MyChart but does not understand them.

## 2021-11-11 NOTE — Telephone Encounter (Signed)
Pt called informed the Keppra level is undetectable. Has she been taking it and not missed any doses? Yes she has been taken it BID.  If she has been taking it, Dr Delice Lesch  would increase Keppra to 1000mg : take 1 tab BID. I sent in updated Rx and we will re-check level in 2 weeks.

## 2021-11-12 ENCOUNTER — Telehealth: Payer: Self-pay | Admitting: Neurology

## 2021-11-12 NOTE — Telephone Encounter (Signed)
Pt called in stating she had ran out of her prescription 3 days before her labs were taken. She is wondering if she could get her labs taken again now that she is taking the Keppra?

## 2021-11-12 NOTE — Telephone Encounter (Signed)
Recheck Keppra level after she has been taking it regularly for a week, thanks

## 2021-11-12 NOTE — Telephone Encounter (Signed)
Pt called for clarification because when I talked to her yesterday she said she had been taken her medication. Pt stated that she ran out of medication on Sunday 3 days before she had her lab work done.

## 2021-11-12 NOTE — Telephone Encounter (Signed)
Pt called no answer left a voice mail that after taken her keppra regular for a week then she can recheck her labs,

## 2021-11-21 ENCOUNTER — Telehealth: Payer: 59 | Admitting: Neurology

## 2021-11-21 ENCOUNTER — Encounter: Payer: Self-pay | Admitting: Neurology

## 2021-11-21 ENCOUNTER — Other Ambulatory Visit (HOSPITAL_COMMUNITY)
Admission: RE | Admit: 2021-11-21 | Discharge: 2021-11-21 | Disposition: A | Discharge status: Home / Self Care | Payer: 59 | Source: Ambulatory Visit | Attending: Obstetrics | Admitting: Obstetrics

## 2021-11-21 ENCOUNTER — Ambulatory Visit (INDEPENDENT_AMBULATORY_CARE_PROVIDER_SITE_OTHER): Payer: 59 | Admitting: Obstetrics

## 2021-11-21 VITALS — BP 105/74 | HR 80 | Wt 168.2 lb

## 2021-11-21 VITALS — Ht 66.0 in | Wt 168.0 lb

## 2021-11-21 DIAGNOSIS — Z113 Encounter for screening for infections with a predominantly sexual mode of transmission: Secondary | ICD-10-CM | POA: Insufficient documentation

## 2021-11-21 DIAGNOSIS — O26892 Other specified pregnancy related conditions, second trimester: Secondary | ICD-10-CM

## 2021-11-21 DIAGNOSIS — Z3A15 15 weeks gestation of pregnancy: Secondary | ICD-10-CM

## 2021-11-21 DIAGNOSIS — Z3480 Encounter for supervision of other normal pregnancy, unspecified trimester: Secondary | ICD-10-CM | POA: Insufficient documentation

## 2021-11-21 DIAGNOSIS — G40009 Localization-related (focal) (partial) idiopathic epilepsy and epileptic syndromes with seizures of localized onset, not intractable, without status epilepticus: Secondary | ICD-10-CM

## 2021-11-21 DIAGNOSIS — R569 Unspecified convulsions: Secondary | ICD-10-CM

## 2021-11-21 DIAGNOSIS — O360120 Maternal care for anti-D [Rh] antibodies, second trimester, not applicable or unspecified: Secondary | ICD-10-CM

## 2021-11-21 DIAGNOSIS — N898 Other specified noninflammatory disorders of vagina: Secondary | ICD-10-CM

## 2021-11-21 DIAGNOSIS — O99891 Other specified diseases and conditions complicating pregnancy: Secondary | ICD-10-CM

## 2021-11-21 DIAGNOSIS — M419 Scoliosis, unspecified: Secondary | ICD-10-CM

## 2021-11-21 LAB — POCT URINALYSIS DIPSTICK OB
Bilirubin, UA: NEGATIVE
Blood, UA: NEGATIVE
Glucose, UA: NEGATIVE
Ketones, UA: NEGATIVE
Nitrite, UA: NEGATIVE
POC,PROTEIN,UA: NEGATIVE
Spec Grav, UA: 1.01 (ref 1.010–1.025)
Urobilinogen, UA: 0.2 E.U./dL
pH, UA: 7.5 (ref 5.0–8.0)

## 2021-11-21 NOTE — Progress Notes (Signed)
Routine Prenatal Care Visit  Subjective  Gina Henry is a 31 y.o. G2P1001 at [redacted]w[redacted]d being seen today for ongoing prenatal care.  She is currently monitored for the following issues for this low-risk pregnancy and has Scoliosis; Varicose veins of other sites; Supervision of other normal pregnancy, antepartum; Rh negative state in antepartum period; and Seizures ([place]) on their problem list.  ----------------------------------------------------------------------------------- Patient reports no complaints.  Her nausea is less. She is taking PNVs. Was treated for yeast, but thinks she is still having sxs. Knows she is having another boy. Contractions: Not present. Vag. Bleeding: None.  Movement: Absent. Leaking Fluid denies.  ----------------------------------------------------------------------------------- The following portions of the patient's history were reviewed and updated as appropriate: allergies, current medications, past family history, past medical history, past social history, past surgical history and problem list. Problem list updated.  Objective  Blood pressure 105/74, pulse 80, weight 76.3 kg, last menstrual period 08/06/2021. Pregravid weight 68 kg Total Weight Gain 8.255 kg Urinalysis: Urine Protein Negative  Urine Glucose Negative  Fetal Status:     Movement: Absent     General:  Alert, oriented and cooperative. Patient is in no acute distress.  Skin: Skin is warm and dry. No rash noted.   Cardiovascular: Normal heart rate noted  Respiratory: Normal respiratory effort, no problems with respiration noted  Abdomen: Soft, gravid, appropriate for gestational age. Pain/Pressure: Present     Pelvic:  Cervical exam deferred        Extremities: Normal range of motion.     Mental Status: Normal mood and affect. Normal behavior. Normal judgment and thought content.   Assessment   31 y.o. G2P1001 at [redacted]w[redacted]d by  05/13/2022, by Last Menstrual Period presenting for routine prenatal  visit  Plan   second Problems (from 10/06/21 to present)    Problem Noted Resolved   Rh negative state in antepartum period 10/20/2021 by Emily Filbert, MD No       Preterm labor symptoms and general obstetric precautions including but not limited to vaginal bleeding, contractions, leaking of fluid and fetal movement were reviewed in detail with the patient. Please refer to After Visit Summary for other counseling recommendations.  aptima swab sent to recheck for yeast.  Return in about 4 weeks (around 12/19/2021) for return OB.  [name], CNM  11/21/2021 10:33 AM
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
## 2021-11-21 NOTE — Patient Instructions (Signed)
Good to see you.  Have Keppra level done on Monday. We will call with results and if any changes need to be done  2. Start daily folic acid 1mg  tablet  3. Follow-up in 2-3 months, call for any changes   Seizure Precautions: 1. If medication has been prescribed for you to prevent seizures, take it exactly as directed.  Do not stop taking the medicine without talking to your doctor first, even if you have not had a seizure in a long time.   2. Avoid activities in which a seizure would cause danger to yourself or to others.  Don't operate dangerous machinery, swim alone, or climb in high or dangerous places, such as on ladders, roofs, or girders.  Do not drive unless your doctor says you may.  3. If you have any warning that you may have a seizure, lay down in a safe place where you can't hurt yourself.    4.  No driving for 6 months from last seizure, as per [employer].   Please refer to the following link on the [facility] website for more information: http://www.epilepsyfoundation.org/answerplace/Social/driving/drivingu.cfm   5.  Maintain good sleep hygiene. Avoid alcohol.  6.  Notify your neurology if you are planning pregnancy or if you become pregnant.  7.  Contact your doctor if you have any problems that may be related to the medicine you are taking.  8.  Call 911 and bring the patient back to the ED if:        A.  The seizure lasts longer than 5 minutes.       B.  The patient doesn't awaken shortly after the seizure  C.  The patient has new problems such as difficulty seeing, speaking or moving  D.  The patient was injured during the seizure  E.  The patient has a temperature over 102 F (39C)  F.  The patient vomited and now is having trouble breathing

## 2021-11-24 ENCOUNTER — Encounter (INDEPENDENT_AMBULATORY_CARE_PROVIDER_SITE_OTHER): Payer: Self-pay

## 2021-11-24 ENCOUNTER — Encounter: Payer: Self-pay | Admitting: Obstetrics

## 2021-11-24 ENCOUNTER — Other Ambulatory Visit (INDEPENDENT_AMBULATORY_CARE_PROVIDER_SITE_OTHER): Payer: 59

## 2021-11-24 DIAGNOSIS — R569 Unspecified convulsions: Secondary | ICD-10-CM | POA: Diagnosis not present

## 2021-11-24 DIAGNOSIS — Z79899 Other long term (current) drug therapy: Secondary | ICD-10-CM

## 2021-11-24 LAB — CERVICOVAGINAL ANCILLARY ONLY
Bacterial Vaginitis (gardnerella): POSITIVE — AB
Candida Glabrata: NEGATIVE
Candida Vaginitis: POSITIVE — AB
Chlamydia: NEGATIVE
Comment: NEGATIVE
Comment: NEGATIVE
Comment: NEGATIVE
Comment: NEGATIVE
Comment: NEGATIVE
Comment: NORMAL
Neisseria Gonorrhea: NEGATIVE
Trichomonas: NEGATIVE

## 2021-11-25 ENCOUNTER — Other Ambulatory Visit: Payer: Self-pay | Admitting: Obstetrics

## 2021-11-25 ENCOUNTER — Encounter: Payer: Self-pay | Admitting: Obstetrics

## 2021-11-25 DIAGNOSIS — B3731 Acute candidiasis of vulva and vagina: Secondary | ICD-10-CM

## 2021-11-25 DIAGNOSIS — Z3480 Encounter for supervision of other normal pregnancy, unspecified trimester: Secondary | ICD-10-CM

## 2021-11-25 DIAGNOSIS — N760 Acute vaginitis: Secondary | ICD-10-CM

## 2021-11-25 MED ORDER — METRONIDAZOLE 500 MG PO TABS: 500.0000 mg | ORAL_TABLET | Freq: Two times a day (BID) | ORAL | 0 refills | Status: AC

## 2021-11-25 MED ORDER — TERCONAZOLE 0.4 % VA CREA: 1.0000 | TOPICAL_CREAM | Freq: Every day | VAGINAL | 0 refills | Status: DC

## 2021-11-26 ENCOUNTER — Telehealth: Payer: Self-pay

## 2021-11-26 NOTE — Telephone Encounter (Signed)
Called pt, no answer, LVMTRC. 

## 2021-11-26 NOTE — Telephone Encounter (Signed)
Pt calling triage saying she had a really bad episode of nausea and vomited at work earlier. She felt like she was going to pass out so she stepped outside to get fresh air and she is feeling better. She just wanted to let us know and ask if she should be seen. I advised to drink plenty of fluids and if she cannot keep anything down within 24 hours to let us know/go to ER. She also wanted to ask about a new medication she started taking for seizures, KEPPRA, if taking BV/Yeast Rx's with this had any side effects? Please advise.

## 2021-11-28 LAB — LEVETIRACETAM LEVEL: Keppra (Levetiracetam): 12.5 ug/mL

## 2021-11-29 ENCOUNTER — Other Ambulatory Visit: Payer: Self-pay | Admitting: Advanced Practice Midwife

## 2021-11-29 DIAGNOSIS — O219 Vomiting of pregnancy, unspecified: Secondary | ICD-10-CM

## 2021-12-01 ENCOUNTER — Telehealth: Payer: Self-pay

## 2021-12-01 MED ORDER — ONDANSETRON 4 MG PO TBDP: 4.0000 mg | ORAL_TABLET | Freq: Four times a day (QID) | ORAL | 0 refills | Status: DC | PRN

## 2021-12-01 NOTE — Telephone Encounter (Signed)
-----   Message from Cameron Sprang, MD sent at 11/30/2021  9:18 PM EST ----- Pls let her know Keppra level is within range, continue current dose, re-check level next month, thanks

## 2021-12-01 NOTE — Telephone Encounter (Signed)
Pt called an informed that Keppra level is within range, continue current dose, re-check level next month,

## 2021-12-17 ENCOUNTER — Encounter: Payer: Self-pay | Admitting: Licensed Practical Nurse

## 2021-12-17 ENCOUNTER — Ambulatory Visit (INDEPENDENT_AMBULATORY_CARE_PROVIDER_SITE_OTHER): Payer: 59

## 2021-12-17 DIAGNOSIS — Z3482 Encounter for supervision of other normal pregnancy, second trimester: Secondary | ICD-10-CM

## 2021-12-17 DIAGNOSIS — Z3689 Encounter for other specified antenatal screening: Secondary | ICD-10-CM | POA: Diagnosis not present

## 2021-12-17 DIAGNOSIS — Z3A19 19 weeks gestation of pregnancy: Secondary | ICD-10-CM

## 2021-12-17 DIAGNOSIS — R569 Unspecified convulsions: Secondary | ICD-10-CM

## 2021-12-17 DIAGNOSIS — Z3480 Encounter for supervision of other normal pregnancy, unspecified trimester: Secondary | ICD-10-CM

## 2021-12-22 ENCOUNTER — Ambulatory Visit (INDEPENDENT_AMBULATORY_CARE_PROVIDER_SITE_OTHER): Payer: 59 | Admitting: Licensed Practical Nurse

## 2021-12-22 VITALS — BP 102/66 | Wt 173.4 lb

## 2021-12-22 DIAGNOSIS — N898 Other specified noninflammatory disorders of vagina: Secondary | ICD-10-CM

## 2021-12-22 DIAGNOSIS — O26892 Other specified pregnancy related conditions, second trimester: Secondary | ICD-10-CM

## 2021-12-22 DIAGNOSIS — O360120 Maternal care for anti-D [Rh] antibodies, second trimester, not applicable or unspecified: Secondary | ICD-10-CM

## 2021-12-22 DIAGNOSIS — Z3A19 19 weeks gestation of pregnancy: Secondary | ICD-10-CM

## 2021-12-22 DIAGNOSIS — Z3480 Encounter for supervision of other normal pregnancy, unspecified trimester: Secondary | ICD-10-CM

## 2021-12-22 NOTE — Progress Notes (Signed)
Routine Prenatal Care Visit  Subjective  Gina Henry is a 31 y.o. G2P1001 at [redacted]w[redacted]d being seen today for ongoing prenatal care.  She is currently monitored for the following issues for this low-risk pregnancy and has Scoliosis; Varicose veins of other sites; Supervision of other normal pregnancy, antepartum; Rh negative state in antepartum period; and Seizures (HCC) on their problem list.  ----------------------------------------------------------------------------------- Patient reports  occasional nausea, food aversions, thinks she may still have yeast infection as she has both internal and external itching, sees white clumpy discharge, has done Monistat 7 and Diflucan. Does shave the pubic area , no recent changes to products.  -had anatomy US a few days ago, normal Here with partner Gina Henry  Contractions: Not present. Vag. Bleeding: None.  Movement: Present. Leaking Fluid denies.  ----------------------------------------------------------------------------------- The following portions of the patient's history were reviewed and updated as appropriate: allergies, current medications, past family history, past medical history, past social history, past surgical history and problem list. Problem list updated.  Objective  Blood pressure 102/66, weight 173 lb 6.4 oz (78.7 kg), last menstrual period 08/06/2021. Pregravid weight 150 lb (68 kg) Total Weight Gain 23 lb 6.4 oz (10.6 kg) Urinalysis: Urine Protein    Urine Glucose    Fetal Status: Fetal Heart Rate (bpm): 140   Movement: Present     General:  Alert, oriented and cooperative. Patient is in no acute distress.  Skin: Skin is warm and dry. No rash noted.   Cardiovascular: Normal heart rate noted  Respiratory: Normal respiratory effort, no problems with respiration noted  Abdomen: Soft, gravid, appropriate for gestational age. Pain/Pressure: Present     Pelvic:  Cervical exam deferred      external genitalia irritated looking, skin  intact, some thick white discharge at introitus   Extremities: Normal range of motion.     Mental Status: Normal mood and affect. Normal behavior. Normal judgment and thought content.   Assessment   31 y.o. G2P1001 at [redacted]w[redacted]d by  05/13/2022, by Last Menstrual Period presenting for routine prenatal visit  Plan   second Problems (from 10/06/21 to present)     Problem Noted Resolved   Rh negative state in antepartum period 10/20/2021 by Allie Bossier, MD No        Preterm labor symptoms and general obstetric precautions including but not limited to vaginal bleeding, contractions, leaking of fluid and fetal movement were reviewed in detail with the patient. Please refer to After Visit Summary for other counseling recommendations.   Return in about 4 weeks (around 01/19/2022) for ROB.  Vaginitis swab sent   Carie Caddy, Ina Homes  [facility]  12/22/21  12:07 PM

## 2021-12-24 LAB — URINE CULTURE

## 2021-12-25 ENCOUNTER — Other Ambulatory Visit: Payer: Self-pay

## 2021-12-25 ENCOUNTER — Telehealth: Payer: Self-pay | Admitting: Licensed Practical Nurse

## 2021-12-25 DIAGNOSIS — B379 Candidiasis, unspecified: Secondary | ICD-10-CM

## 2021-12-25 LAB — NUSWAB VAGINITIS PLUS (VG+)
Candida albicans, NAA: POSITIVE — AB
Candida glabrata, NAA: NEGATIVE
Chlamydia trachomatis, NAA: NEGATIVE
Neisseria gonorrhoeae, NAA: NEGATIVE
Trich vag by NAA: NEGATIVE

## 2021-12-25 MED ORDER — FLUCONAZOLE 150 MG PO TABS: 150.0000 mg | ORAL_TABLET | Freq: Once | ORAL | 0 refills | Status: AC

## 2021-12-25 NOTE — Telephone Encounter (Signed)
Patient is calling to for up on prescription for yeast infection that was show on her test result. Please advise?

## 2021-12-29 ENCOUNTER — Other Ambulatory Visit: Payer: Self-pay | Admitting: Licensed Practical Nurse

## 2021-12-29 ENCOUNTER — Encounter: Payer: Self-pay | Admitting: Licensed Practical Nurse

## 2021-12-29 DIAGNOSIS — B3731 Acute candidiasis of vulva and vagina: Secondary | ICD-10-CM

## 2021-12-29 MED ORDER — FLUCONAZOLE 150 MG PO TABS: 150.0000 mg | ORAL_TABLET | ORAL | 0 refills | Status: AC

## 2021-12-29 NOTE — Telephone Encounter (Signed)
Hi Gina Henry, pt called office last week after seeing POS yeast results and diflucan Rx 1 tablet was sent. She has picked up the Rx and she says she feels like her sx have cleared up, she feels okay. She wants to know if she still needs to pick up the Rx you sent today?

## 2021-12-29 NOTE — Telephone Encounter (Signed)
Pt aware.

## 2021-12-29 NOTE — Telephone Encounter (Signed)
This was sent in on Friday. 

## 2021-12-31 ENCOUNTER — Other Ambulatory Visit: Payer: Self-pay | Admitting: Advanced Practice Midwife

## 2021-12-31 DIAGNOSIS — O219 Vomiting of pregnancy, unspecified: Secondary | ICD-10-CM

## 2021-12-31 MED ORDER — ONDANSETRON 4 MG PO TBDP: 4.0000 mg | ORAL_TABLET | Freq: Four times a day (QID) | ORAL | 0 refills | Status: DC | PRN

## 2022-01-20 ENCOUNTER — Encounter: Payer: Self-pay | Admitting: Obstetrics and Gynecology

## 2022-01-20 ENCOUNTER — Ambulatory Visit (INDEPENDENT_AMBULATORY_CARE_PROVIDER_SITE_OTHER): Payer: 59 | Admitting: Obstetrics and Gynecology

## 2022-01-20 VITALS — BP 101/67 | HR 83 | Wt 175.8 lb

## 2022-01-20 DIAGNOSIS — Z113 Encounter for screening for infections with a predominantly sexual mode of transmission: Secondary | ICD-10-CM

## 2022-01-20 DIAGNOSIS — Z131 Encounter for screening for diabetes mellitus: Secondary | ICD-10-CM

## 2022-01-20 DIAGNOSIS — Z3482 Encounter for supervision of other normal pregnancy, second trimester: Secondary | ICD-10-CM

## 2022-01-20 DIAGNOSIS — Z3A23 23 weeks gestation of pregnancy: Secondary | ICD-10-CM

## 2022-01-20 LAB — POCT URINALYSIS DIPSTICK OB
Bilirubin, UA: NEGATIVE
Blood, UA: NEGATIVE
Glucose, UA: NEGATIVE
Ketones, UA: NEGATIVE
Nitrite, UA: NEGATIVE
POC,PROTEIN,UA: NEGATIVE
Spec Grav, UA: 1.015 (ref 1.010–1.025)
Urobilinogen, UA: 0.2 E.U./dL
pH, UA: 6 (ref 5.0–8.0)

## 2022-01-20 NOTE — Progress Notes (Signed)
ROB. Patient states daily fetal movement along with some back pain. She states some change in color on her inner thighs. Patient states no questions or concerns at this time.

## 2022-01-20 NOTE — Progress Notes (Signed)
ROB: Doing well.  Reports fetal movement.  Increased melanin and pigmentation discussed during pregnancy.  1 hour GCT next visit.

## 2022-01-22 ENCOUNTER — Ambulatory Visit: Payer: 59 | Admitting: Neurology

## 2022-01-22 ENCOUNTER — Encounter: Payer: Self-pay | Admitting: Neurology

## 2022-01-22 DIAGNOSIS — Z029 Encounter for administrative examinations, unspecified: Secondary | ICD-10-CM

## 2022-01-26 NOTE — L&D Delivery Note (Signed)
Delivery Note   Gina Henry is a 32 y.o. G2P1001 at [redacted]w[redacted]d Estimated Date of Delivery: 05/13/22  PRE-OPERATIVE DIAGNOSIS:  1) [redacted]w[redacted]d pregnancy.   POST-OPERATIVE DIAGNOSIS:  1) [redacted]w[redacted]d pregnancy s/p Vaginal, Spontaneous   Delivery Type: Vaginal, Spontaneous    Delivery Anesthesia: Epidural   Labor Complications: None    ESTIMATED BLOOD LOSS: 50  ml    FINDINGS:   1) female infant, "Drake," Apgar scores of 8   at 1 minute and 9   at 5 minutes; birthweight pending  SPECIMENS:   PLACENTA:   Appearance: Intact    Removal: Spontaneous      Disposition: Per protocol  CORD BLOOD: Collected  DISPOSITION:  Infant left in stable condition in the delivery room, with L&D personnel and mother  NARRATIVE SUMMARY: Labor course:  Gina Henry is a G2P1001 at [redacted]w[redacted]d who presented to Labor & Delivery for labor. Her initial cervical exam was 3/50/-2. Labor proceeded spontaneously. AROM was performed at 1118 and she was found to be completely dilated at 1418. With excellent maternal pushing effort, she birthed a viable female infant at 59. A loose nuchal cord was reduced on the perineum. The shoulders were birthed without difficulty. The infant was placed skin-to-skin with [facility]. The cord was doubly clamped and cut by the father when pulsations ceased. The placenta delivered spontaneously and was noted to be intact with a 3VC. A perineal and vaginal examination was performed. Episiotomy/Lacerations: None , small hemostatic labial abrasions, not repaired The patient tolerated this well. Mother and baby were left in stable condition.   [name], CNM 05/14/2022 3:15 PM

## 2022-02-02 ENCOUNTER — Other Ambulatory Visit: Payer: Self-pay | Admitting: Advanced Practice Midwife

## 2022-02-02 DIAGNOSIS — O219 Vomiting of pregnancy, unspecified: Secondary | ICD-10-CM

## 2022-02-17 ENCOUNTER — Encounter: Payer: 59 | Admitting: Obstetrics and Gynecology

## 2022-02-17 ENCOUNTER — Other Ambulatory Visit: Payer: 59

## 2022-02-20 ENCOUNTER — Encounter: Payer: Self-pay | Admitting: Obstetrics and Gynecology

## 2022-02-20 ENCOUNTER — Other Ambulatory Visit: Payer: 59

## 2022-02-20 ENCOUNTER — Ambulatory Visit (INDEPENDENT_AMBULATORY_CARE_PROVIDER_SITE_OTHER): Payer: 59 | Admitting: Obstetrics and Gynecology

## 2022-02-20 VITALS — BP 98/66 | HR 93 | Wt 177.6 lb

## 2022-02-20 DIAGNOSIS — Z3A28 28 weeks gestation of pregnancy: Secondary | ICD-10-CM

## 2022-02-20 DIAGNOSIS — Z2913 Encounter for prophylactic Rho(D) immune globulin: Secondary | ICD-10-CM

## 2022-02-20 DIAGNOSIS — Z3483 Encounter for supervision of other normal pregnancy, third trimester: Secondary | ICD-10-CM

## 2022-02-20 DIAGNOSIS — O360130 Maternal care for anti-D [Rh] antibodies, third trimester, not applicable or unspecified: Secondary | ICD-10-CM

## 2022-02-20 DIAGNOSIS — Z23 Encounter for immunization: Secondary | ICD-10-CM

## 2022-02-20 LAB — POCT URINALYSIS DIPSTICK OB
Bilirubin, UA: NEGATIVE
Blood, UA: NEGATIVE
Glucose, UA: NEGATIVE
Ketones, UA: NEGATIVE
Nitrite, UA: NEGATIVE
POC,PROTEIN,UA: NEGATIVE
Spec Grav, UA: 1.01 (ref 1.010–1.025)
Urobilinogen, UA: 0.2 E.U./dL
pH, UA: 5 (ref 5.0–8.0)

## 2022-02-20 MED ORDER — RHO D IMMUNE GLOBULIN 1500 UNIT/2ML IJ SOSY: 300.0000 ug | PREFILLED_SYRINGE | Freq: Once | INTRAMUSCULAR | Status: DC

## 2022-02-20 MED ORDER — RHO D IMMUNE GLOBULIN 1500 UNIT/2ML IJ SOSY
300.0000 ug | PREFILLED_SYRINGE | Freq: Once | INTRAMUSCULAR | Status: AC
  Administered 2022-02-20: 300 ug via INTRAMUSCULAR

## 2022-02-20 NOTE — Progress Notes (Signed)
ROB. Patient states daily fetal movement. Patient completed GCT, received Rhogam, TDAP injection and signed BTC today. Patient states no questions or concerns at this time.

## 2022-02-20 NOTE — Progress Notes (Signed)
Gina Henry: Reports she is doing well.  Daily fetal movement.  She has been craving ice lately and wonders if her blood count is a little low.  1 hour GCT today with CBC.  Advised use of oral iron with meals.

## 2022-02-21 LAB — CBC WITH DIFF/PLATELET
Basophils Absolute: 0.1 10*3/uL (ref 0.0–0.2)
Basos: 1 %
EOS (ABSOLUTE): 0.2 10*3/uL (ref 0.0–0.4)
Eos: 1 %
Hematocrit: 29.7 % — ABNORMAL LOW (ref 34.0–46.6)
Hemoglobin: 9.7 g/dL — ABNORMAL LOW (ref 11.1–15.9)
Immature Grans (Abs): 0.1 10*3/uL (ref 0.0–0.1)
Immature Granulocytes: 1 %
Lymphocytes Absolute: 1.7 10*3/uL (ref 0.7–3.1)
Lymphs: 12 %
MCH: 27.6 pg (ref 26.6–33.0)
MCHC: 32.7 g/dL (ref 31.5–35.7)
MCV: 84 fL (ref 79–97)
Monocytes Absolute: 1 10*3/uL — ABNORMAL HIGH (ref 0.1–0.9)
Monocytes: 7 %
Neutrophils Absolute: 11 10*3/uL — ABNORMAL HIGH (ref 1.4–7.0)
Neutrophils: 78 %
Platelets: 373 10*3/uL (ref 150–450)
RBC: 3.52 x10E6/uL — ABNORMAL LOW (ref 3.77–5.28)
RDW: 12.9 % (ref 11.7–15.4)
WBC: 14.2 10*3/uL — ABNORMAL HIGH (ref 3.4–10.8)

## 2022-02-21 LAB — GLUCOSE, 1 HOUR GESTATIONAL: Gestational Diabetes Screen: 98 mg/dL (ref 70–139)

## 2022-02-21 LAB — RPR: RPR Ser Ql: NONREACTIVE

## 2022-02-23 ENCOUNTER — Encounter: Payer: Self-pay | Admitting: Obstetrics and Gynecology

## 2022-03-09 ENCOUNTER — Ambulatory Visit (INDEPENDENT_AMBULATORY_CARE_PROVIDER_SITE_OTHER): Payer: 59

## 2022-03-09 ENCOUNTER — Telehealth: Payer: Self-pay

## 2022-03-09 ENCOUNTER — Telehealth: Payer: Self-pay | Admitting: Neurology

## 2022-03-09 VITALS — BP 98/66 | HR 93 | Ht 66.0 in | Wt 178.3 lb

## 2022-03-09 DIAGNOSIS — O368130 Decreased fetal movements, third trimester, not applicable or unspecified: Secondary | ICD-10-CM

## 2022-03-09 DIAGNOSIS — Z3483 Encounter for supervision of other normal pregnancy, third trimester: Secondary | ICD-10-CM | POA: Diagnosis not present

## 2022-03-09 DIAGNOSIS — Z3A30 30 weeks gestation of pregnancy: Secondary | ICD-10-CM

## 2022-03-09 NOTE — Patient Instructions (Signed)

## 2022-03-09 NOTE — Telephone Encounter (Signed)
Patient had a seizure yesterday [redacted] weeks pregnant and would like to speak to some one

## 2022-03-09 NOTE — Telephone Encounter (Signed)
Pt called triage and said she had a seizure yesterday and wanted to know if she should be seen? Has appt with Deneise Lever on 2/16. Spoke with Dr. Hulan Fray, she should come in for NST and she can see her. Pt has been scheduled for this AM with Dr. Hulan Fray.

## 2022-03-09 NOTE — Progress Notes (Signed)
    NURSE VISIT NOTE  Subjective:    Patient ID: Gina Henry, female    DOB: 1990/11/15, 32 y.o.   MRN: [id number]  HPI  Patient is a 32 y.o. G61P1001 female who presents for fetal monitoring per order from Clovia Cuff, MD.   Objective:    BP 98/66   Pulse 93   Ht 5\' 6"  (1.676 m)   Wt 178 lb 4.8 oz (80.9 kg)   LMP 08/06/2021 (Exact Date)   BMI 28.78 kg/m  Estimated Date of Delivery: 05/13/22  Assessment:   1. Encounter for supervision of other normal pregnancy, third trimester   2. Decreased fetal movements in third trimester, single or unspecified fetus   3. [redacted] weeks gestation of pregnancy      Plan:   Results reviewed and discussed with patient by  Clovia Cuff, MD.     Otila Kluver, LPN

## 2022-03-10 ENCOUNTER — Encounter: Payer: Self-pay | Admitting: Neurology

## 2022-03-10 ENCOUNTER — Ambulatory Visit: Payer: 59 | Admitting: Neurology

## 2022-03-10 ENCOUNTER — Encounter: Payer: Self-pay | Admitting: Obstetrics & Gynecology

## 2022-03-10 ENCOUNTER — Telehealth: Payer: Self-pay

## 2022-03-10 ENCOUNTER — Other Ambulatory Visit (INDEPENDENT_AMBULATORY_CARE_PROVIDER_SITE_OTHER): Payer: 59

## 2022-03-10 VITALS — BP 93/62 | HR 93 | Ht 66.0 in | Wt 179.2 lb

## 2022-03-10 DIAGNOSIS — G40009 Localization-related (focal) (partial) idiopathic epilepsy and epileptic syndromes with seizures of localized onset, not intractable, without status epilepticus: Secondary | ICD-10-CM

## 2022-03-10 MED ORDER — MAGIC MOUTHWASH W/LIDOCAINE: ORAL | 5 refills | Status: DC
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
## 2022-03-10 NOTE — Telephone Encounter (Signed)
Pt c/o: seizure Missed medications?  Yes.   Missed sat night dose an had seizure on Sunday morning Sleep deprived?  Yes.   Has to get up to go to the bathroom at night Alcohol intake?  No. Increased stress? No. Normal work stress  Any change in medication color or shape? No. Back to their usual baseline self?  Yes.  . If no, advise go to ER Triggers? " Her head felt weird" Current medications prescribed by Dr. Delice Lesch: levetracetam 500 mg BID

## 2022-03-10 NOTE — Telephone Encounter (Signed)
Noted, seen today 03/10/22

## 2022-03-10 NOTE — Patient Instructions (Signed)
Good to see you.  Have Keppra level checked today and in March and April  2. We will let you know how much to increase Keppra after getting level, for now continue Keppra 552m twice a day. Set an alarm for reminders  3. Magic mouthwash will be sent for the tongue and lip injury  4. Let [place] when ready to schedule MRI brain and prolonged EEG   5. Follow-up in 3 months, call for any changes   Seizure Precautions: 1. If medication has been prescribed for you to prevent seizures, take it exactly as directed.  Do not stop taking the medicine without talking to your doctor first, even if you have not had a seizure in a long time.   2. Avoid activities in which a seizure would cause danger to yourself or to others.  Don't operate dangerous machinery, swim alone, or climb in high or dangerous places, such as on ladders, roofs, or girders.  Do not drive unless your doctor says you may.  3. If you have any warning that you may have a seizure, lay down in a safe place where you can't hurt yourself.    4.  No driving for 6 months from last seizure, as per [facility]   Please refer to the following link on the [place] for more information: http://www.epilepsyfoundation.org/answerplace/Social/driving/drivingu.cfm   5.  Maintain good sleep hygiene. Avoid alcohol.  6.  Notify your neurology if you are planning pregnancy or if you become pregnant.  7.  Contact your doctor if you have any problems that may be related to the medicine you are taking.  8.  Call 911 and bring the patient back to the ED if:        A.  The seizure lasts longer than 5 minutes.       B.  The patient doesn't awaken shortly after the seizure  C.  The patient has new problems such as difficulty seeing, speaking or moving  D.  The patient was injured during the seizure  E.  The patient has a temperature over 102 F (39C)  F.  The patient vomited and now is having trouble  breathing

## 2022-03-13 ENCOUNTER — Ambulatory Visit (INDEPENDENT_AMBULATORY_CARE_PROVIDER_SITE_OTHER): Payer: 59 | Admitting: Certified Nurse Midwife

## 2022-03-13 ENCOUNTER — Encounter: Payer: Self-pay | Admitting: Certified Nurse Midwife

## 2022-03-13 ENCOUNTER — Encounter: Payer: Self-pay | Admitting: Neurology

## 2022-03-13 VITALS — BP 114/69 | HR 105 | Wt 179.1 lb

## 2022-03-13 DIAGNOSIS — R82998 Other abnormal findings in urine: Secondary | ICD-10-CM

## 2022-03-13 DIAGNOSIS — Z3483 Encounter for supervision of other normal pregnancy, third trimester: Secondary | ICD-10-CM

## 2022-03-13 DIAGNOSIS — Z3A31 31 weeks gestation of pregnancy: Secondary | ICD-10-CM

## 2022-03-13 LAB — POCT URINALYSIS DIPSTICK OB
Bilirubin, UA: NEGATIVE
Blood, UA: NEGATIVE
Glucose, UA: NEGATIVE
Ketones, UA: NEGATIVE
Nitrite, UA: NEGATIVE
Spec Grav, UA: 1.015 (ref 1.010–1.025)
Urobilinogen, UA: 0.2 E.U./dL
pH, UA: 7 (ref 5.0–8.0)

## 2022-03-13 NOTE — Addendum Note (Signed)
Addended by: Minette Headland on: 03/13/2022 09:41 AM   Modules accepted: Orders

## 2022-03-13 NOTE — Progress Notes (Addendum)
ROB , pt state she had another seizure Sunday evening. She states her neurologist requests that we are looking at the vitamins in her urine? Asked pt to clarify with neurologist. Encouraged her to have neurologist reach out to [place] with specific recommendations for urine /testing. She agrees. Explained that we are looking for signs of UTI and protein that we do not evaluate for vitamins in urine.   She is feeling good movement today.   She has not other concerns. Follow up 2 wks.   Urine dip today positive large leuk, orders placed for culture.    Philip Aspen, CNM

## 2022-03-14 ENCOUNTER — Encounter: Payer: Self-pay | Admitting: Neurology

## 2022-03-14 LAB — LEVETIRACETAM LEVEL: Keppra (Levetiracetam): 4.9 ug/mL — ABNORMAL LOW

## 2022-03-14 MED ORDER — LEVETIRACETAM 1000 MG PO TABS: ORAL_TABLET | ORAL | 3 refills | Status: DC

## 2022-03-16 ENCOUNTER — Other Ambulatory Visit: Payer: Self-pay | Admitting: Advanced Practice Midwife

## 2022-03-16 DIAGNOSIS — O219 Vomiting of pregnancy, unspecified: Secondary | ICD-10-CM

## 2022-03-17 ENCOUNTER — Encounter: Payer: Self-pay | Admitting: Certified Nurse Midwife

## 2022-03-17 ENCOUNTER — Other Ambulatory Visit: Payer: Self-pay

## 2022-03-17 ENCOUNTER — Other Ambulatory Visit: Payer: Self-pay | Admitting: Certified Nurse Midwife

## 2022-03-17 LAB — URINE CULTURE

## 2022-03-17 MED ORDER — NITROFURANTOIN MONOHYD MACRO 100 MG PO CAPS: 100.0000 mg | ORAL_CAPSULE | Freq: Two times a day (BID) | ORAL | 0 refills | Status: AC

## 2022-03-17 MED ORDER — ONDANSETRON 4 MG PO TBDP: 4.0000 mg | ORAL_TABLET | Freq: Three times a day (TID) | ORAL | 0 refills | Status: DC | PRN

## 2022-03-17 NOTE — Telephone Encounter (Signed)
Pt called stating she has questions for Dr Delice Lesch.

## 2022-03-26 ENCOUNTER — Encounter: Payer: Self-pay | Admitting: Obstetrics and Gynecology

## 2022-03-26 ENCOUNTER — Ambulatory Visit (INDEPENDENT_AMBULATORY_CARE_PROVIDER_SITE_OTHER): Payer: 59 | Admitting: Obstetrics and Gynecology

## 2022-03-26 VITALS — BP 105/65 | HR 86 | Wt 184.5 lb

## 2022-03-26 DIAGNOSIS — R519 Headache, unspecified: Secondary | ICD-10-CM

## 2022-03-26 DIAGNOSIS — Z3483 Encounter for supervision of other normal pregnancy, third trimester: Secondary | ICD-10-CM

## 2022-03-26 DIAGNOSIS — Z3A33 33 weeks gestation of pregnancy: Secondary | ICD-10-CM

## 2022-03-26 DIAGNOSIS — G40909 Epilepsy, unspecified, not intractable, without status epilepticus: Secondary | ICD-10-CM

## 2022-03-26 LAB — POCT URINALYSIS DIPSTICK OB
Bilirubin, UA: NEGATIVE
Blood, UA: NEGATIVE
Glucose, UA: NEGATIVE
Ketones, UA: NEGATIVE
Nitrite, UA: NEGATIVE
Spec Grav, UA: <=1.005 — AB (ref 1.010–1.025)
Urobilinogen, UA: 0.2 E.U./dL
pH, UA: 6 (ref 5.0–8.0)

## 2022-03-26 NOTE — Progress Notes (Signed)
ROB [redacted]w[redacted]d She feels tired today. She reports good fetal movement today. She has no new concerns.

## 2022-03-26 NOTE — Progress Notes (Signed)
ROB: Patient is a 32 y.o. G2P1001 at 42w1dwho presents for routine care. Patient reports daily headaches. Thinks it is due to her seizure medication as this is a known side effect. Dose was increased recently due to having a seizure on 2/11.  Advised on trying Magnesium supplements.  Feeling good movement, denies contractions, LOF. To f/u in 2 weeks.

## 2022-04-08 ENCOUNTER — Ambulatory Visit (INDEPENDENT_AMBULATORY_CARE_PROVIDER_SITE_OTHER): Payer: 59 | Admitting: Certified Nurse Midwife

## 2022-04-08 ENCOUNTER — Encounter: Payer: Self-pay | Admitting: Certified Nurse Midwife

## 2022-04-08 VITALS — BP 106/72 | HR 103 | Wt 182.3 lb

## 2022-04-08 DIAGNOSIS — Z3483 Encounter for supervision of other normal pregnancy, third trimester: Secondary | ICD-10-CM

## 2022-04-08 DIAGNOSIS — Z3480 Encounter for supervision of other normal pregnancy, unspecified trimester: Secondary | ICD-10-CM

## 2022-04-08 DIAGNOSIS — Z3A35 35 weeks gestation of pregnancy: Secondary | ICD-10-CM

## 2022-04-08 LAB — POCT URINALYSIS DIPSTICK OB
Bilirubin, UA: NEGATIVE
Blood, UA: NEGATIVE
Glucose, UA: NEGATIVE
Ketones, UA: NEGATIVE
Leukocytes, UA: NEGATIVE
Nitrite, UA: NEGATIVE
POC,PROTEIN,UA: NEGATIVE
Spec Grav, UA: 1.01 (ref 1.010–1.025)
Urobilinogen, UA: 0.2 E.U./dL
pH, UA: 7 (ref 5.0–8.0)

## 2022-04-08 NOTE — Progress Notes (Signed)
Gina Henry , feeling good movement. Denies any recent episodes of seizure. Denies contractions. Discussed GBS swab next visit. She verbalizes and agrees. Pt requesting note to stop work due to seizures , medication side effects, pregnancy , and recommendation for not driving. She notes that the keppra causes headaches and tylenol, magnesium not helping. In addition she has been advised to not drive due to seizures, she is not able to have someone drive her to work everyday. I am in agreement with this , she is going to talk with her family to discuss dates to stop working but she is considering next week due to driving issues and headache. She will let me know.   Follow up 1 wk or prn .   Philip Aspen, CNM

## 2022-04-08 NOTE — Patient Instructions (Signed)

## 2022-04-09 ENCOUNTER — Encounter: Payer: Self-pay | Admitting: Certified Nurse Midwife

## 2022-04-11 ENCOUNTER — Ambulatory Visit
Admission: EM | Admit: 2022-04-11 | Discharge: 2022-04-11 | Disposition: A | Discharge status: Home / Self Care | Payer: 59 | Attending: Urgent Care | Admitting: Urgent Care

## 2022-04-11 DIAGNOSIS — H1032 Unspecified acute conjunctivitis, left eye: Secondary | ICD-10-CM

## 2022-04-11 MED ORDER — ERYTHROMYCIN 5 MG/GM OP OINT: TOPICAL_OINTMENT | OPHTHALMIC | 0 refills | Status: DC

## 2022-04-11 MED ORDER — PAZEO 0.7 % OP SOLN: 1.0000 [drp] | Freq: Every day | OPHTHALMIC | 0 refills | Status: DC

## 2022-04-11 NOTE — ED Triage Notes (Signed)
Patient presents to UC for possible pink eye. C/o of left eye redness and drainage since last night.

## 2022-04-11 NOTE — ED Provider Notes (Signed)
[facility]    CSN: [id number] Arrival date & time: 04/11/22  1138      History   Chief Complaint Chief Complaint  Patient presents with   Eye Problem    HPI Gina Henry is a 32 y.o. female.   Pleasant 32 year old female presents today with a primary concern of left eye redness and irritation.  She states it started yesterday.  She works in a Soil scientist and also reports some cold-like symptoms including congestion, dry cough.  She states that her 42-year-old son at home also has cold-like symptoms as well.  No fever.  Patient reports that she woke up this morning with her left eye crusted shut.  No additional discharge throughout the day.  She denies any change in vision, blind spots, photophobia.  She denies any headache.  She denies any lid swelling, pain with extraocular movements. No contact lens use. Pt takes keppra daily, recent dose increase.    Eye Problem   Past Medical History:  Diagnosis Date   High cholesterol    Hyperlipidemia    Seizures ([place]) 10/03/2021   first seizure was 07/11/2021    Patient Active Problem List   Diagnosis Date Noted   Decreased fetal movements in third trimester 03/09/2022   Seizures ([place]) 10/21/2021   Rh negative state in antepartum period 10/20/2021   Encounter for supervision of other normal pregnancy, third trimester 10/06/2021   Varicose veins of other sites 07/12/2020   Scoliosis 11/23/2013    Past Surgical History:  Procedure Laterality Date   WISDOM TOOTH EXTRACTION     four; 2016/2017    OB History     Gravida  2   Para  1   Term  1   Preterm      AB      Living  1      SAB      IAB      Ectopic      Multiple      Live Births  1            Home Medications    Prior to Admission medications   Medication Sig Start Date End Date Taking? Authorizing Provider  erythromycin ophthalmic ointment Place a 1/2 inch ribbon of ointment into the lower eyelid of left eye 3 times daily x  5 days. 04/11/22  Yes Ambika Zettlemoyer L, PA  Olopatadine HCl (PAZEO) 0.7 % SOLN Apply 1 drop to eye daily. 04/11/22  Yes Marquerite Forsman L, PA  ferrous sulfate 325 (65 FE) MG tablet Take 325 mg by mouth daily with breakfast.    [provider]  levETIRAcetam (KEPPRA) 1000 MG tablet Take 1 tablet twice a day 03/14/22   Cameron Sprang, MD  ondansetron (ZOFRAN-ODT) 4 MG disintegrating tablet Take 1 tablet (4 mg total) by mouth every 8 (eight) hours as needed for nausea or vomiting. 03/17/22   Rod Can, CNM  Prenatal Vit-Fe Fumarate-FA (MULTIVITAMIN-PRENATAL) 27-0.8 MG TABS tablet Take 1 tablet by mouth daily at 12 noon.    [provider]    Family History Family History  Problem Relation Age of Onset   Varicose Veins Mother    Colon cancer Father 50       died at 48   Hypertension Maternal Grandmother    Hyperlipidemia Maternal Grandmother    Stroke Maternal Grandmother    Hypertension Maternal Grandfather    Hyperlipidemia Maternal Grandfather    Cancer Maternal Grandfather    High  blood pressure Maternal Aunt     Social History Social History   Tobacco Use   Smoking status: Never   Smokeless tobacco: Never  Vaping Use   Vaping Use: Never used  Substance Use Topics   Alcohol use: Not Currently    Comment: occassionally   Drug use: Not Currently    Types: Marijuana     Allergies   Patient has no known allergies.   Review of Systems Review of Systems As per HPI  Physical Exam Triage Vital Signs ED Triage Vitals  Enc Vitals Group     BP 04/11/22 1231 112/73     Pulse Rate 04/11/22 1231 98     Resp 04/11/22 1231 16     Temp 04/11/22 1231 97.9 F (36.6 C)     Temp Source 04/11/22 1231 Temporal     SpO2 04/11/22 1231 98 %     Weight --      Height --      Head Circumference --      Peak Flow --      Pain Score 04/11/22 1233 0     Pain Loc --      Pain Edu? --      Excl. in [place]? --    No data found.  Updated Vital Signs BP 112/73 (BP  Location: Right Arm)   Pulse 98   Temp 97.9 F (36.6 C) (Temporal)   Resp 16   LMP 08/06/2021 (Exact Date)   SpO2 98%   Visual Acuity Right Eye Distance:   Left Eye Distance:   Bilateral Distance:    Right Eye Near:   Left Eye Near:    Bilateral Near:     Physical Exam Constitutional:      General: She is not in acute distress.    Appearance: Normal appearance. She is normal weight. She is not ill-appearing or toxic-appearing.  HENT:     Head: Normocephalic and atraumatic.     Right Ear: Ear canal normal. A middle ear effusion is present. Tympanic membrane is not injected, scarred, perforated or erythematous.     Left Ear: A middle ear effusion is present. Tympanic membrane is not injected, scarred, perforated or erythematous.     Nose: Congestion and rhinorrhea present.     Right Sinus: No maxillary sinus tenderness or frontal sinus tenderness.     Left Sinus: No maxillary sinus tenderness or frontal sinus tenderness.     Mouth/Throat:     Lips: Pink.     Mouth: Mucous membranes are moist.     Pharynx: Oropharynx is clear. Uvula midline. No pharyngeal swelling, oropharyngeal exudate, posterior oropharyngeal erythema or uvula swelling.  Eyes:     General: Lids are normal. Lids are everted, no foreign bodies appreciated. Vision grossly intact. Gaze aligned appropriately. No allergic shiner, visual field deficit or scleral icterus.       Right eye: No foreign body, discharge or hordeolum.        Left eye: No foreign body, discharge or hordeolum.     Extraocular Movements: Extraocular movements intact.     Right eye: Normal extraocular motion and no nystagmus.     Left eye: Normal extraocular motion and no nystagmus.     Conjunctiva/sclera:     Right eye: Right conjunctiva is not injected. No chemosis, exudate or hemorrhage.    Left eye: Left conjunctiva is injected. No chemosis, exudate or hemorrhage.    Comments: No ciliary flush L sclera minimally injected, conjunctiva WNL  Cardiovascular:     Rate and Rhythm: Normal rate and regular rhythm.     Pulses: Normal pulses.     Heart sounds: No murmur heard. Pulmonary:     Effort: Pulmonary effort is normal. No respiratory distress.     Breath sounds: Normal breath sounds. No stridor. No wheezing, rhonchi or rales.  Musculoskeletal:     Cervical back: Normal range of motion and neck supple. No rigidity or tenderness.  Lymphadenopathy:     Cervical: No cervical adenopathy.  Skin:    General: Skin is warm and dry.     Findings: No bruising, erythema or rash.  Neurological:     General: No focal deficit present.     Mental Status: She is alert and oriented to person, place, and time.      UC Treatments / Results  Labs (all labs ordered are listed, but only abnormal results are displayed) Labs Reviewed - No data to display  EKG   Radiology No results found.  Procedures Procedures (including critical care time)  Medications Ordered in UC Medications - No data to display  Initial Impression / Assessment and Plan / UC Course  I have reviewed the triage vital signs and the nursing notes.  Pertinent labs & imaging results that were available during my care of the patient were reviewed by me and considered in my medical decision making (see chart for details).     Acute conjunctivitis -  Given additional URI symptoms, suspect viral versus allergic.  Will give [place] eyedrops which are compatible with pregnancy.  Will also use erythromycin ointment for prevention of secondary bacterial infection.   Final Clinical Impressions(s) / UC Diagnoses   Final diagnoses:  Acute conjunctivitis of left eye, unspecified acute conjunctivitis type     Discharge Instructions      You have conjunctivitis, I suspect either viral or allergic.  This does not appear bacterial.  The most common cause is Adenovirus. THIS IS HIGHLY CONTAGIOUS. Please avoid touching your eye; if you do, [place]! I have  prescribed erythromycin eye ointment to PREVENT secondary bacterial infection. Use this on the left eye three times daily x 5 days. If the ointment is too hard to administer, hold it in your warm hands for 5 minutes prior and it will act more like a drop.  Warm moist washcloths with Wynetta Emery and Johnson baby shampoo can be used to gently massage and cleans to eyes.  To cover for any allergic trigger, I have also called in pazeo, an antihistamine eye drop. Use one drop in left eye every 24 hours as needed.  Return to clinic if any fever, eye pain, or change in vision occurs.      ED Prescriptions     Medication Sig Dispense Auth. Provider   erythromycin ophthalmic ointment Place a 1/2 inch ribbon of ointment into the lower eyelid of left eye 3 times daily x 5 days. 3.5 g Muaad Boehning L, PA   Olopatadine HCl (PAZEO) 0.7 % SOLN Apply 1 drop to eye daily. 2.5 mL Denyce Harr L, PA      PDMP not reviewed this encounter.   Chaney Malling, [place] 04/11/22 1308

## 2022-04-11 NOTE — Discharge Instructions (Signed)
You have conjunctivitis, I suspect either viral or allergic.  This does not appear bacterial.  The most common cause is Adenovirus. THIS IS HIGHLY CONTAGIOUS. Please avoid touching your eye; if you do, [place]! I have prescribed erythromycin eye ointment to PREVENT secondary bacterial infection. Use this on the left eye three times daily x 5 days. If the ointment is too hard to administer, hold it in your warm hands for 5 minutes prior and it will act more like a drop.  Warm moist washcloths with Wynetta Emery and Johnson baby shampoo can be used to gently massage and cleans to eyes.  To cover for any allergic trigger, I have also called in pazeo, an antihistamine eye drop. Use one drop in left eye every 24 hours as needed.  Return to clinic if any fever, eye pain, or change in vision occurs.

## 2022-04-13 ENCOUNTER — Other Ambulatory Visit (INDEPENDENT_AMBULATORY_CARE_PROVIDER_SITE_OTHER): Payer: 59

## 2022-04-13 DIAGNOSIS — G40009 Localization-related (focal) (partial) idiopathic epilepsy and epileptic syndromes with seizures of localized onset, not intractable, without status epilepticus: Secondary | ICD-10-CM

## 2022-04-17 ENCOUNTER — Ambulatory Visit (INDEPENDENT_AMBULATORY_CARE_PROVIDER_SITE_OTHER): Payer: 59 | Admitting: Obstetrics and Gynecology

## 2022-04-17 ENCOUNTER — Encounter: Payer: Self-pay | Admitting: Obstetrics and Gynecology

## 2022-04-17 ENCOUNTER — Telehealth: Payer: Self-pay

## 2022-04-17 ENCOUNTER — Other Ambulatory Visit (HOSPITAL_COMMUNITY)
Admission: RE | Admit: 2022-04-17 | Discharge: 2022-04-17 | Disposition: A | Discharge status: Home / Self Care | Payer: 59 | Source: Ambulatory Visit | Attending: Obstetrics and Gynecology | Admitting: Obstetrics and Gynecology

## 2022-04-17 VITALS — BP 116/77 | HR 84 | Wt 183.6 lb

## 2022-04-17 DIAGNOSIS — Z3480 Encounter for supervision of other normal pregnancy, unspecified trimester: Secondary | ICD-10-CM | POA: Insufficient documentation

## 2022-04-17 DIAGNOSIS — Z3A36 36 weeks gestation of pregnancy: Secondary | ICD-10-CM

## 2022-04-17 DIAGNOSIS — Z87440 Personal history of urinary (tract) infections: Secondary | ICD-10-CM

## 2022-04-17 DIAGNOSIS — Z113 Encounter for screening for infections with a predominantly sexual mode of transmission: Secondary | ICD-10-CM | POA: Insufficient documentation

## 2022-04-17 DIAGNOSIS — Z3685 Encounter for antenatal screening for Streptococcus B: Secondary | ICD-10-CM

## 2022-04-17 DIAGNOSIS — Z3483 Encounter for supervision of other normal pregnancy, third trimester: Secondary | ICD-10-CM

## 2022-04-17 LAB — LEVETIRACETAM LEVEL: Keppra (Levetiracetam): 22.4 ug/mL

## 2022-04-17 LAB — POCT URINALYSIS DIPSTICK OB
Bilirubin, UA: NEGATIVE
Blood, UA: NEGATIVE
Glucose, UA: NEGATIVE
Ketones, UA: NEGATIVE
Nitrite, UA: NEGATIVE
POC,PROTEIN,UA: NEGATIVE
Spec Grav, UA: 1.01 (ref 1.010–1.025)
Urobilinogen, UA: 0.2 E.U./dL
pH, UA: 6.5 (ref 5.0–8.0)

## 2022-04-17 NOTE — Telephone Encounter (Signed)
-----   Message from Cameron Sprang, MD sent at 04/17/2022 12:08 PM EDT ----- Pls let her know Keppra level looks good, has she had any seizures? If none, continue Keppra 1000mg  BID, recheck level next month. Thanks

## 2022-04-17 NOTE — Progress Notes (Signed)
ROB. Patient states daily fetal movement. She has concerns regarding her HR, states it jumped to 140 while sitting down at work. GC/CT and GBS cultures ordered. Patient states no questions or concerns at this time.

## 2022-04-17 NOTE — Telephone Encounter (Signed)
Pt called an informed Keppra level looks good, has she had any seizures? No seizures noted If none, continue Keppra 1000mg  BID, recheck level next month.

## 2022-04-17 NOTE — Progress Notes (Signed)
ROB: Generally doing well.  Denies contractions.  Cultures done today GBS-GC/CT.  Patient states that occasionally she has a racing heartbeat and in the past she has associated that with seizures.  She has not had any further seizures.  She is taking the Keppra.  We will continue to monitor this and consider cardiology referral if heart rate continues to spike.

## 2022-04-19 LAB — URINE CULTURE

## 2022-04-19 LAB — STREP GP B NAA: Strep Gp B NAA: NEGATIVE

## 2022-04-20 ENCOUNTER — Telehealth: Payer: Self-pay | Admitting: Certified Nurse Midwife

## 2022-04-20 LAB — CERVICOVAGINAL ANCILLARY ONLY
Chlamydia: NEGATIVE
Comment: NEGATIVE
Comment: NORMAL
Neisseria Gonorrhea: NEGATIVE

## 2022-04-20 NOTE — Telephone Encounter (Signed)
Called and left a message for Ms. Dunkleberger to call the office in reference to 1 Week ROB appointment.  We can offer her an appointment tomorrow with Deneise Lever.

## 2022-04-21 ENCOUNTER — Encounter: Payer: Self-pay | Admitting: Certified Nurse Midwife

## 2022-04-21 ENCOUNTER — Ambulatory Visit (INDEPENDENT_AMBULATORY_CARE_PROVIDER_SITE_OTHER): Payer: 59 | Admitting: Certified Nurse Midwife

## 2022-04-21 VITALS — HR 90 | Wt 183.0 lb

## 2022-04-21 DIAGNOSIS — Z3483 Encounter for supervision of other normal pregnancy, third trimester: Secondary | ICD-10-CM

## 2022-04-21 DIAGNOSIS — Z3A36 36 weeks gestation of pregnancy: Secondary | ICD-10-CM

## 2022-04-21 LAB — POCT URINALYSIS DIPSTICK OB
Bilirubin, UA: NEGATIVE
Blood, UA: NEGATIVE
Glucose, UA: NEGATIVE
Ketones, UA: NEGATIVE
Leukocytes, UA: NEGATIVE
Nitrite, UA: NEGATIVE
POC,PROTEIN,UA: NEGATIVE
Spec Grav, UA: 1.01 (ref 1.010–1.025)
Urobilinogen, UA: 0.2 E.U./dL
pH, UA: 6.5 (ref 5.0–8.0)

## 2022-04-21 NOTE — Telephone Encounter (Signed)
I contacted the patient via phone. I left message for the patient to call back to confirm appointment on 4/2 with MMF.

## 2022-04-21 NOTE — Progress Notes (Signed)
ROB doing well. Feeling good fetal movement. Reviewed labor precautions. Follow up 1 wk for ROB.   Philip Aspen, CNM

## 2022-04-22 ENCOUNTER — Telehealth: Payer: Self-pay | Admitting: Neurology

## 2022-04-22 ENCOUNTER — Telehealth: Payer: Self-pay

## 2022-04-22 DIAGNOSIS — G40009 Localization-related (focal) (partial) idiopathic epilepsy and epileptic syndromes with seizures of localized onset, not intractable, without status epilepticus: Secondary | ICD-10-CM

## 2022-04-22 MED ORDER — LEVETIRACETAM 1000 MG PO TABS: ORAL_TABLET | ORAL | 3 refills | Status: DC

## 2022-04-22 NOTE — Telephone Encounter (Signed)
Gina Henry called triage line this morning she had a seizure last night, she did not hit her head, she was in bed, she has felt the baby move, She also stated she had her levels checked last week and they we're in normal range. She is still currently taking her Keppra.   She was calling in to see if she needs to do anything, please advise?

## 2022-04-22 NOTE — Telephone Encounter (Signed)
Pt called informed that Would increase Keppra 1000mg : Take 1 and 1/2 tablets twice a day, recheck level in a week again RX was updated and lab order placed

## 2022-04-22 NOTE — Telephone Encounter (Signed)
Would increase Keppra 1000mg : Take 1 and 1/2 tablets twice a day, recheck level in a week again. Pls send updated Rx, Thanks

## 2022-04-22 NOTE — Telephone Encounter (Signed)
Pt called in stating she had a seizure overnight. She is [redacted] weeks pregnant. She is also going to call her OBGYN. She just had her Keppra levels checked last week. She is not sure if she should change anything?

## 2022-04-22 NOTE — Telephone Encounter (Signed)
Pt c/o: seizure Missed medications?  No. Sleep deprived?  Yes.   Yes some times hard to sleep  Alcohol intake?  No. Increased stress? Yes.   Little at work its been busy  Any change in medication color or shape? No. Any trigger? No she was in bed  Back to their usual baseline self?  Yes.  . If no, advise go to ER Current medications prescribed by Dr. Delice Lesch: levetiracetam 1000mg   Take 1 tablet twice a day

## 2022-04-28 ENCOUNTER — Other Ambulatory Visit: Payer: Self-pay | Admitting: Obstetrics

## 2022-04-28 ENCOUNTER — Encounter: Payer: Self-pay | Admitting: Obstetrics

## 2022-04-28 ENCOUNTER — Ambulatory Visit (INDEPENDENT_AMBULATORY_CARE_PROVIDER_SITE_OTHER): Payer: 59 | Admitting: Obstetrics

## 2022-04-28 VITALS — BP 101/67 | HR 73 | Wt 183.5 lb

## 2022-04-28 DIAGNOSIS — Z3A37 37 weeks gestation of pregnancy: Secondary | ICD-10-CM

## 2022-04-28 DIAGNOSIS — Z3480 Encounter for supervision of other normal pregnancy, unspecified trimester: Secondary | ICD-10-CM

## 2022-04-28 DIAGNOSIS — Z3483 Encounter for supervision of other normal pregnancy, third trimester: Secondary | ICD-10-CM

## 2022-04-28 LAB — POCT URINALYSIS DIPSTICK OB
Bilirubin, UA: NEGATIVE
Blood, UA: NEGATIVE
Glucose, UA: NEGATIVE
Ketones, UA: NEGATIVE
Nitrite, UA: NEGATIVE
POC,PROTEIN,UA: NEGATIVE
Spec Grav, UA: 1.01 (ref 1.010–1.025)
Urobilinogen, UA: 1 E.U./dL
pH, UA: 6.5 (ref 5.0–8.0)

## 2022-04-28 MED ORDER — HYDROCORT-PRAMOXINE (PERIANAL) 1-1 % EX FOAM: 1.0000 | Freq: Two times a day (BID) | CUTANEOUS | Status: DC

## 2022-04-28 NOTE — Progress Notes (Signed)
Routine Prenatal Care Visit  Subjective  Gina Henry is a 32 y.o. G2P1001 at [redacted]w[redacted]d being seen today for ongoing prenatal care.  She is currently monitored for the following issues for this low-risk pregnancy and has Scoliosis; Varicose veins of other sites; Encounter for supervision of other normal pregnancy, third trimester; Rh negative state in antepartum period; Seizures; and Decreased fetal movements in third trimester on their problem list.  ----------------------------------------------------------------------------------- Patient reports no complaints.  She idi have  a seizure last week.has seen the neuro MD this past week. Contractions: Not present. Vag. Bleeding: None.  Movement: Present. Leaking Fluid denies.  ----------------------------------------------------------------------------------- The following portions of the patient's history were reviewed and updated as appropriate: allergies, current medications, past family history, past medical history, past social history, past surgical history and problem list. Problem list updated.  Objective  Blood pressure 101/67, pulse 73, weight 183 lb 8 oz (83.2 kg), last menstrual period 08/06/2021. Pregravid weight 150 lb (68 kg) Total Weight Gain 33 lb 8 oz (15.2 kg) Urinalysis: Urine Protein    Urine Glucose    Fetal Status:     Movement: Present     General:  Alert, oriented and cooperative. Patient is in no acute distress.  Skin: Skin is warm and dry. No rash noted.   Cardiovascular: Normal heart rate noted  Respiratory: Normal respiratory effort, no problems with respiration noted  Abdomen: Soft, gravid, appropriate for gestational age. Pain/Pressure: Present     Pelvic:  Cervical exam deferred        Extremities: Normal range of motion.  Edema: Trace  Mental Status: Normal mood and affect. Normal behavior. Normal judgment and thought content.   Assessment   32 y.o. G2P1001 at [redacted]w[redacted]d by  05/13/2022, by Last Menstrual Period  presenting for routine prenatal visit  Plan   second Problems (from 10/06/21 to present)     Problem Noted Resolved   Rh negative state in antepartum period 10/20/2021 by Emily Filbert, MD No        Term labor symptoms and general obstetric precautions including but not limited to vaginal bleeding, contractions, leaking of fluid and fetal movement were reviewed in detail with the patient. Please refer to After Visit Summary for other counseling recommendations.  Discussed the usual faster labors with second babies.  She will likely get an epidural this time. Has not picked a name for her son yet. Here with her mother.  [name], CNM  04/28/2022 10:07 AM    No follow-ups on file.  [name], CNM  04/28/2022 9:47 AM

## 2022-04-30 ENCOUNTER — Other Ambulatory Visit: Payer: Self-pay

## 2022-04-30 ENCOUNTER — Other Ambulatory Visit: Payer: Self-pay | Admitting: Obstetrics

## 2022-04-30 DIAGNOSIS — K644 Residual hemorrhoidal skin tags: Secondary | ICD-10-CM

## 2022-04-30 MED ORDER — HYDROCORT-PRAMOXINE (PERIANAL) 1-1 % EX FOAM: 1.0000 | Freq: Two times a day (BID) | CUTANEOUS | 1 refills | Status: DC

## 2022-05-01 ENCOUNTER — Other Ambulatory Visit (INDEPENDENT_AMBULATORY_CARE_PROVIDER_SITE_OTHER): Payer: 59

## 2022-05-01 DIAGNOSIS — G40009 Localization-related (focal) (partial) idiopathic epilepsy and epileptic syndromes with seizures of localized onset, not intractable, without status epilepticus: Secondary | ICD-10-CM

## 2022-05-05 ENCOUNTER — Encounter: Payer: Self-pay | Admitting: Licensed Practical Nurse

## 2022-05-05 ENCOUNTER — Ambulatory Visit (INDEPENDENT_AMBULATORY_CARE_PROVIDER_SITE_OTHER): Payer: 59 | Admitting: Licensed Practical Nurse

## 2022-05-05 VITALS — BP 111/62 | HR 86 | Wt 185.0 lb

## 2022-05-05 DIAGNOSIS — Z3A38 38 weeks gestation of pregnancy: Secondary | ICD-10-CM

## 2022-05-05 DIAGNOSIS — Z3483 Encounter for supervision of other normal pregnancy, third trimester: Secondary | ICD-10-CM

## 2022-05-05 LAB — POCT URINALYSIS DIPSTICK OB
Bilirubin, UA: NEGATIVE
Blood, UA: NEGATIVE
Glucose, UA: NEGATIVE
Ketones, UA: NEGATIVE
Nitrite, UA: NEGATIVE
POC,PROTEIN,UA: NEGATIVE
Spec Grav, UA: 1.01 (ref 1.010–1.025)
Urobilinogen, UA: 1 E.U./dL
pH, UA: 6.5 (ref 5.0–8.0)

## 2022-05-05 NOTE — Progress Notes (Signed)
Routine Prenatal Care Visit  Subjective  Gina Henry is a 32 y.o. G2P1001 at [redacted]w[redacted]d being seen today for ongoing prenatal care.  She is currently monitored for the following issues for this high-risk pregnancy and has Scoliosis; Varicose veins of other sites; Encounter for supervision of other normal pregnancy, third trimester; Rh negative state in antepartum period; Seizures; and Decreased fetal movements in third trimester on their problem list.  ----------------------------------------------------------------------------------- Patient reports  sleep disturbances, mood has been up and down-per her mother .   -Here with her mother -Another grandparent will be with her son while in labor -Feels ready for labor -Had blood drawn with Neuro, waiting for results.  -reviewed postdate POC, plan BPP, sweep when able. IOL at 41 wks-understands method of induction is based on cervical exam. Labored at [facility] after a sweep with last pregnancy.   Contractions: Not present. Vag. Bleeding: None.  Movement: Present. Leaking Fluid denies.  ----------------------------------------------------------------------------------- The following portions of the patient's history were reviewed and updated as appropriate: allergies, current medications, past family history, past medical history, past social history, past surgical history and problem list. Problem list updated.  Objective  Blood pressure 111/62, pulse 86, weight 185 lb (83.9 kg), last menstrual period 08/06/2021. Pregravid weight 150 lb (68 kg) Total Weight Gain 35 lb (15.9 kg) Urinalysis: Urine Protein    Urine Glucose    Fetal Status: Fetal Heart Rate (bpm): 120 Fundal Height: 37 cm Movement: Present     General:  Alert, oriented and cooperative. Patient is in no acute distress.  Skin: Skin is warm and dry. No rash noted.   Cardiovascular: Normal heart rate noted  Respiratory: Normal respiratory effort, no problems with respiration noted   Abdomen: Soft, gravid, appropriate for gestational age. Pain/Pressure: Present     Pelvic:  Cervical exam performed Dilation: Closed Effacement (%): Thick Station: Ballotable  Extremities: Normal range of motion.  Edema: Trace  Mental Status: Normal mood and affect. Normal behavior. Normal judgment and thought content.   Assessment   31 y.o. G2P1001 at [redacted]w[redacted]d by  05/13/2022, by Last Menstrual Period presenting for routine prenatal visit  Plan   second Problems (from 10/06/21 to present)     Problem Noted Resolved   Rh negative state in antepartum period 10/20/2021 by Allie Bossier, MD No        Term labor symptoms and general obstetric precautions including but not limited to vaginal bleeding, contractions, leaking of fluid and fetal movement were reviewed in detail with the patient. Please refer to After Visit Summary for other counseling recommendations.   Return in about 1 week (around 05/12/2022) for ROB. BPP ordered  Carie Caddy, CNM   [facility]  05/05/22  9:41 AM

## 2022-05-07 LAB — LEVETIRACETAM LEVEL: Keppra (Levetiracetam): 24.7 ug/mL

## 2022-05-11 ENCOUNTER — Ambulatory Visit (INDEPENDENT_AMBULATORY_CARE_PROVIDER_SITE_OTHER): Payer: 59

## 2022-05-11 ENCOUNTER — Encounter: Payer: 59 | Admitting: Obstetrics and Gynecology

## 2022-05-11 ENCOUNTER — Ambulatory Visit (INDEPENDENT_AMBULATORY_CARE_PROVIDER_SITE_OTHER): Payer: 59 | Admitting: Obstetrics and Gynecology

## 2022-05-11 ENCOUNTER — Encounter: Payer: Self-pay | Admitting: Obstetrics and Gynecology

## 2022-05-11 VITALS — BP 113/78 | HR 86 | Wt 186.0 lb

## 2022-05-11 DIAGNOSIS — Z3A39 39 weeks gestation of pregnancy: Secondary | ICD-10-CM

## 2022-05-11 DIAGNOSIS — Z3483 Encounter for supervision of other normal pregnancy, third trimester: Secondary | ICD-10-CM | POA: Diagnosis not present

## 2022-05-11 LAB — POCT URINALYSIS DIPSTICK OB
Bilirubin, UA: NEGATIVE
Blood, UA: NEGATIVE
Glucose, UA: NEGATIVE
Ketones, UA: NEGATIVE
Leukocytes, UA: NEGATIVE
Nitrite, UA: NEGATIVE
POC,PROTEIN,UA: NEGATIVE
Spec Grav, UA: 1.015 (ref 1.010–1.025)
Urobilinogen, UA: 0.2 E.U./dL
pH, UA: 6 (ref 5.0–8.0)

## 2022-05-11 NOTE — Progress Notes (Signed)
ROB: Patient not having contractions.  She feels well.  Asking for membrane sweep today.  Induction scheduled for 4/24 5 AM.  Discussed with patient.  Will need one visit next week with AFI.  BPP today 8 out of 8

## 2022-05-11 NOTE — Progress Notes (Signed)
ROB. Patient states daily fetal movement with occasional pressure. Would like a cervical check today. BPP today.

## 2022-05-11 NOTE — Addendum Note (Signed)
Addended by: Elonda Husky on: 05/11/2022 03:33 PM   Modules accepted: Orders

## 2022-05-13 ENCOUNTER — Other Ambulatory Visit: Payer: 59

## 2022-05-14 ENCOUNTER — Other Ambulatory Visit: Payer: Self-pay

## 2022-05-14 ENCOUNTER — Inpatient Hospital Stay: Payer: 59 | Admitting: General Practice

## 2022-05-14 ENCOUNTER — Inpatient Hospital Stay
Admission: EM | Admit: 2022-05-14 | Discharge: 2022-05-15 | DRG: 806 | Disposition: A | Discharge status: Home / Self Care | Payer: 59 | Attending: Obstetrics and Gynecology | Admitting: Obstetrics and Gynecology

## 2022-05-14 ENCOUNTER — Encounter: Payer: Self-pay | Admitting: Obstetrics and Gynecology

## 2022-05-14 DIAGNOSIS — R569 Unspecified convulsions: Secondary | ICD-10-CM

## 2022-05-14 DIAGNOSIS — O26893 Other specified pregnancy related conditions, third trimester: Secondary | ICD-10-CM | POA: Diagnosis present

## 2022-05-14 DIAGNOSIS — O99891 Other specified diseases and conditions complicating pregnancy: Secondary | ICD-10-CM | POA: Diagnosis not present

## 2022-05-14 DIAGNOSIS — Z3A39 39 weeks gestation of pregnancy: Secondary | ICD-10-CM

## 2022-05-14 DIAGNOSIS — O360130 Maternal care for anti-D [Rh] antibodies, third trimester, not applicable or unspecified: Secondary | ICD-10-CM

## 2022-05-14 DIAGNOSIS — O480 Post-term pregnancy: Principal | ICD-10-CM | POA: Diagnosis present

## 2022-05-14 DIAGNOSIS — Z8249 Family history of ischemic heart disease and other diseases of the circulatory system: Secondary | ICD-10-CM | POA: Diagnosis not present

## 2022-05-14 DIAGNOSIS — O874 Varicose veins of lower extremity in the puerperium: Secondary | ICD-10-CM | POA: Diagnosis present

## 2022-05-14 DIAGNOSIS — Z3A40 40 weeks gestation of pregnancy: Secondary | ICD-10-CM | POA: Diagnosis not present

## 2022-05-14 DIAGNOSIS — O09293 Supervision of pregnancy with other poor reproductive or obstetric history, third trimester: Secondary | ICD-10-CM

## 2022-05-14 DIAGNOSIS — O99354 Diseases of the nervous system complicating childbirth: Secondary | ICD-10-CM | POA: Diagnosis present

## 2022-05-14 DIAGNOSIS — G40909 Epilepsy, unspecified, not intractable, without status epilepticus: Secondary | ICD-10-CM | POA: Diagnosis present

## 2022-05-14 DIAGNOSIS — Z6791 Unspecified blood type, Rh negative: Secondary | ICD-10-CM | POA: Diagnosis not present

## 2022-05-14 DIAGNOSIS — O26899 Other specified pregnancy related conditions, unspecified trimester: Principal | ICD-10-CM

## 2022-05-14 LAB — ABO/RH: ABO/RH(D): A NEG

## 2022-05-14 LAB — CBC
HCT: 33.9 % — ABNORMAL LOW (ref 36.0–46.0)
Hemoglobin: 11 g/dL — ABNORMAL LOW (ref 12.0–15.0)
MCH: 26.8 pg (ref 26.0–34.0)
MCHC: 32.4 g/dL (ref 30.0–36.0)
MCV: 82.7 fL (ref 80.0–100.0)
Platelets: 307 10*3/uL (ref 150–400)
RBC: 4.1 MIL/uL (ref 3.87–5.11)
RDW: 15 % (ref 11.5–15.5)
WBC: 18.1 10*3/uL — ABNORMAL HIGH (ref 4.0–10.5)
nRBC: 0 % (ref 0.0–0.2)

## 2022-05-14 LAB — TYPE AND SCREEN
ABO/RH(D): A NEG
Antibody Screen: POSITIVE

## 2022-05-14 MED ORDER — OXYCODONE-ACETAMINOPHEN 5-325 MG PO TABS: 1.0000 | ORAL_TABLET | ORAL | Status: DC | PRN

## 2022-05-14 MED ORDER — AMMONIA AROMATIC IN INHA
RESPIRATORY_TRACT | Status: AC
  Filled 2022-05-14: qty 10

## 2022-05-14 MED ORDER — LACTATED RINGERS IV SOLN: 500.0000 mL | Freq: Once | INTRAVENOUS | Status: DC

## 2022-05-14 MED ORDER — LACTATED RINGERS IV SOLN
500.0000 mL | INTRAVENOUS | Status: DC | PRN
  Administered 2022-05-14: 1000 mL via INTRAVENOUS

## 2022-05-14 MED ORDER — DIPHENHYDRAMINE HCL 50 MG/ML IJ SOLN: 12.5000 mg | INTRAMUSCULAR | Status: DC | PRN

## 2022-05-14 MED ORDER — FENTANYL CITRATE (PF) 100 MCG/2ML IJ SOLN
50.0000 ug | INTRAMUSCULAR | Status: DC | PRN
  Administered 2022-05-14: 100 ug via INTRAVENOUS

## 2022-05-14 MED ORDER — OXYTOCIN-SODIUM CHLORIDE 30-0.9 UT/500ML-% IV SOLN: 2.5000 [IU]/h | INTRAVENOUS | Status: DC | PRN

## 2022-05-14 MED ORDER — DIPHENHYDRAMINE HCL 25 MG PO CAPS: 25.0000 mg | ORAL_CAPSULE | Freq: Four times a day (QID) | ORAL | Status: DC | PRN

## 2022-05-14 MED ORDER — MISOPROSTOL 25 MCG QUARTER TABLET: 25.0000 ug | ORAL_TABLET | ORAL | Status: DC

## 2022-05-14 MED ORDER — SOD CITRATE-CITRIC ACID 500-334 MG/5ML PO SOLN: 30.0000 mL | ORAL | Status: DC | PRN

## 2022-05-14 MED ORDER — EPHEDRINE 5 MG/ML INJ: 10.0000 mg | INTRAVENOUS | Status: DC | PRN

## 2022-05-14 MED ORDER — LIDOCAINE-EPINEPHRINE (PF) 1.5 %-1:200000 IJ SOLN
INTRAMUSCULAR | Status: DC | PRN
  Administered 2022-05-14: 3 mL via PERINEURAL

## 2022-05-14 MED ORDER — METHYLERGONOVINE MALEATE 0.2 MG PO TABS: 0.2000 mg | ORAL_TABLET | ORAL | Status: DC | PRN

## 2022-05-14 MED ORDER — BENZOCAINE-MENTHOL 20-0.5 % EX AERO
1.0000 | INHALATION_SPRAY | CUTANEOUS | Status: DC | PRN
  Filled 2022-05-14: qty 56

## 2022-05-14 MED ORDER — OXYTOCIN-SODIUM CHLORIDE 30-0.9 UT/500ML-% IV SOLN
1.0000 m[IU]/min | INTRAVENOUS | Status: DC
  Filled 2022-05-14: qty 500

## 2022-05-14 MED ORDER — ACETAMINOPHEN 325 MG PO TABS: 650.0000 mg | ORAL_TABLET | ORAL | Status: DC | PRN

## 2022-05-14 MED ORDER — MISOPROSTOL 200 MCG PO TABS
ORAL_TABLET | ORAL | Status: AC
  Filled 2022-05-14: qty 4

## 2022-05-14 MED ORDER — PRENATAL MULTIVITAMIN CH
1.0000 | ORAL_TABLET | Freq: Every day | ORAL | Status: DC
  Administered 2022-05-15: 1 via ORAL
  Filled 2022-05-14: qty 1

## 2022-05-14 MED ORDER — ACETAMINOPHEN 325 MG PO TABS
650.0000 mg | ORAL_TABLET | ORAL | Status: DC | PRN
  Administered 2022-05-15: 650 mg via ORAL
  Filled 2022-05-14: qty 2

## 2022-05-14 MED ORDER — DOCUSATE SODIUM 100 MG PO CAPS
100.0000 mg | ORAL_CAPSULE | Freq: Two times a day (BID) | ORAL | Status: DC
  Administered 2022-05-15 (×2): 100 mg via ORAL
  Filled 2022-05-14 (×2): qty 1

## 2022-05-14 MED ORDER — BUPIVACAINE HCL (PF) 0.25 % IJ SOLN
INTRAMUSCULAR | Status: DC | PRN
  Administered 2022-05-14: 4 mL via EPIDURAL
  Administered 2022-05-14: 2 mL via EPIDURAL

## 2022-05-14 MED ORDER — OXYTOCIN BOLUS FROM INFUSION
333.0000 mL | Freq: Once | INTRAVENOUS | Status: AC
  Administered 2022-05-14: 333 mL via INTRAVENOUS

## 2022-05-14 MED ORDER — LIDOCAINE HCL (PF) 1 % IJ SOLN
INTRAMUSCULAR | Status: DC | PRN
  Administered 2022-05-14: 3 mL via SUBCUTANEOUS

## 2022-05-14 MED ORDER — FENTANYL-BUPIVACAINE-NACL 0.5-0.125-0.9 MG/250ML-% EP SOLN
12.0000 mL/h | EPIDURAL | Status: DC | PRN
  Administered 2022-05-14: 12 mL/h via EPIDURAL
  Filled 2022-05-14: qty 250

## 2022-05-14 MED ORDER — OXYTOCIN 10 UNIT/ML IJ SOLN
INTRAMUSCULAR | Status: AC
  Filled 2022-05-14: qty 2

## 2022-05-14 MED ORDER — SODIUM CHLORIDE 0.9 % IV SOLN: 1.0000 g | INTRAVENOUS | Status: DC

## 2022-05-14 MED ORDER — OXYTOCIN-SODIUM CHLORIDE 30-0.9 UT/500ML-% IV SOLN: 2.5000 [IU]/h | INTRAVENOUS | Status: DC

## 2022-05-14 MED ORDER — TERBUTALINE SULFATE 1 MG/ML IJ SOLN: 0.2500 mg | Freq: Once | INTRAMUSCULAR | Status: DC | PRN

## 2022-05-14 MED ORDER — PHENYLEPHRINE 80 MCG/ML (10ML) SYRINGE FOR IV PUSH (FOR BLOOD PRESSURE SUPPORT): 80.0000 ug | PREFILLED_SYRINGE | INTRAVENOUS | Status: DC | PRN

## 2022-05-14 MED ORDER — SODIUM CHLORIDE 0.9 % IV SOLN: 2.0000 g | Freq: Once | INTRAVENOUS | Status: DC

## 2022-05-14 MED ORDER — METHYLERGONOVINE MALEATE 0.2 MG/ML IJ SOLN: 0.2000 mg | INTRAMUSCULAR | Status: DC | PRN

## 2022-05-14 MED ORDER — LIDOCAINE HCL (PF) 1 % IJ SOLN
INTRAMUSCULAR | Status: AC
  Filled 2022-05-14: qty 30

## 2022-05-14 MED ORDER — ONDANSETRON HCL 4 MG/2ML IJ SOLN
4.0000 mg | Freq: Four times a day (QID) | INTRAMUSCULAR | Status: DC | PRN
  Filled 2022-05-14: qty 2

## 2022-05-14 MED ORDER — SIMETHICONE 80 MG PO CHEW: 80.0000 mg | CHEWABLE_TABLET | ORAL | Status: DC | PRN

## 2022-05-14 MED ORDER — IBUPROFEN 600 MG PO TABS
600.0000 mg | ORAL_TABLET | Freq: Four times a day (QID) | ORAL | Status: DC
  Administered 2022-05-14 – 2022-05-15 (×4): 600 mg via ORAL
  Filled 2022-05-14 (×4): qty 1

## 2022-05-14 MED ORDER — LACTATED RINGERS IV SOLN: INTRAVENOUS | Status: DC

## 2022-05-14 MED ORDER — FENTANYL CITRATE (PF) 100 MCG/2ML IJ SOLN
INTRAMUSCULAR | Status: AC
  Filled 2022-05-14: qty 2

## 2022-05-14 MED ORDER — LIDOCAINE HCL (PF) 1 % IJ SOLN: 30.0000 mL | INTRAMUSCULAR | Status: DC | PRN

## 2022-05-14 MED ORDER — LEVETIRACETAM 500 MG PO TABS
1000.0000 mg | ORAL_TABLET | Freq: Two times a day (BID) | ORAL | Status: DC
  Administered 2022-05-14 (×2): 1000 mg via ORAL
  Administered 2022-05-15: 1500 mg via ORAL

## 2022-05-14 NOTE — Anesthesia Procedure Notes (Signed)
Epidural Patient location during procedure: OB Start time: 05/14/2022 10:07 AM End time: 05/14/2022 10:13 AM  Staffing Anesthesiologist: Stephanie Coup, MD Resident/CRNA: Lynden Oxford, CRNA Performed: resident/CRNA   Preanesthetic Checklist Completed: patient identified, IV checked, site marked, risks and benefits discussed, monitors and equipment checked, pre-op evaluation and timeout performed  Epidural Patient position: sitting Prep: ChloraPrep Patient monitoring: heart rate, continuous pulse ox and blood pressure Approach: midline Location: L4-L5 Injection technique: LOR saline  Needle:  Needle type: Tuohy  Needle gauge: 18 G Needle length: 9 cm and 9 Needle insertion depth: 4 cm Catheter type: closed end flexible Catheter size: 20 Guage Catheter at skin depth: 12 cm Test dose: negative and 1.5% lidocaine with Epi 1:200 K  Assessment Events: blood not aspirated, no cerebrospinal fluid, injection not painful, no injection resistance, no paresthesia and negative IV test  Additional Notes   Patient tolerated the insertion well without complications.Reason for block:procedure for pain

## 2022-05-14 NOTE — Anesthesia Preprocedure Evaluation (Signed)
Anesthesia Evaluation  Patient identified by MRN, date of birth, ID band Patient awake    Reviewed: Allergy & Precautions, H&P , NPO status   Airway Mallampati: II       Dental   Pulmonary           Cardiovascular (-) hypertension     Neuro/Psych Seizures -,     GI/Hepatic   Endo/Other    Renal/GU      Musculoskeletal   Abdominal   Peds  Hematology   Anesthesia Other Findings   Reproductive/Obstetrics (+) Pregnancy                              Anesthesia Physical Anesthesia Plan  ASA: 2  Anesthesia Plan: Epidural   Post-op Pain Management:    Induction:   PONV Risk Score and Plan:   Airway Management Planned:   Additional Equipment:   Intra-op Plan:   Post-operative Plan:   Informed Consent:   Plan Discussed with: CRNA and Anesthesiologist  Anesthesia Plan Comments:          Anesthesia Quick Evaluation

## 2022-05-14 NOTE — Discharge Summary (Signed)
Postpartum Discharge Summary  Date of Service updated 05/15/2022     Patient Name: Gina Henry DOB: Jul 03, 1990 MRN: [id number]  Date of admission: 05/14/2022 Delivery date:05/14/2022  Delivering provider: Glenetta Borg  Date of discharge: 05/15/2022  Admitting diagnosis: Post-dates pregnancy [O48.0] Intrauterine pregnancy: [redacted]w[redacted]d     Secondary diagnosis:  Principal Problem:   Post-dates pregnancy  Additional problems: Seizure disorder    Discharge diagnosis: Term Pregnancy Delivered                                              Post partum procedures: none Augmentation: AROM Complications: None  Hospital course: Onset of Labor With Vaginal Delivery      32 y.o. yo G2P1001 at [redacted]w[redacted]d was admitted in Latent Labor on 05/14/2022. Labor course was uncomplicated  Membrane Rupture Time/Date: 11:18 AM ,05/14/2022   Delivery Method:Vaginal, Spontaneous  Episiotomy: None  Lacerations:  None    Pt. Is eating, hydrating, and voiding regularly without difficulty. Has yet to have BM. She is bottle feeding per preference, baby is taking the bottle well. Reports small amount of vaginal bleeding, denies passing large blood clots. Has had cramping abdomen pain relieved with tylenol/ibuprofen. Unsure of plans for contraception although will consider options and decide by six week postpartum visit, plans abstinence for now. Denies anxiety/depression symptoms. Endorses good support from partner and family.      Newborn Data: Birth date:05/14/2022  Birth time:2:30 PM  Gender:Female  Living status:Living  Apgars:8 ,9  Weight:3190 g   Magnesium Sulfate received: No BMZ received: No Rhophylac:Yes MMR:N/A T-DaP:Given prenatally Flu: No Transfusion:No  Physical exam  Vitals:   05/14/22 1926 05/14/22 2327 05/15/22 0329 05/15/22 0827  BP: 97/61 106/67 (!) 91/59 96/63  Pulse: 75 (!) 53 67 67  Resp: Temp: 98 F (36.7 C) 97.8 F (36.6 C) 97.9 F (36.6 C) 97.9 F (36.6 C)   TempSrc: Oral Oral Oral Oral  SpO2: 100% 98% 97% 99%  Weight:      Height:       General: alert, cooperative, and no distress Lochia: appropriate Uterine Fundus: firm Incision: N/A DVT Evaluation: No evidence of DVT seen on physical exam. Labs: Lab Results  Component Value Date   WBC 18.1 (H) 05/14/2022   HGB 11.0 (L) 05/14/2022   HCT 33.9 (L) 05/14/2022   MCV 82.7 05/14/2022   PLT 307 05/14/2022      Latest Ref Rng & Units 07/11/2021    6:57 PM  CMP  Glucose 70 - 99 mg/dL 99   BUN 6 - 20 mg/dL 10   Creatinine 4.09 - 1.00 mg/dL 8.11   Sodium 914 - 782 mmol/L 135   Potassium 3.5 - 5.1 mmol/L 3.4   Chloride 98 - 111 mmol/L 107   CO2 22 - 32 mmol/L 17   Calcium 8.9 - 10.3 mg/dL 8.6    Edinburgh Score:    10/06/2021    9:34 AM  Edinburgh Postnatal Depression Scale Screening Tool  I have been able to laugh and see the funny side of things. 0  I have looked forward with enjoyment to things. 0  I have blamed myself unnecessarily when things went wrong. 0  I have been anxious or worried for no good reason. 0  I have felt scared or panicky for no good reason. 0  Things have been getting on top of me. 1  I have been so unhappy that I have had difficulty sleeping. 0  I have felt sad or miserable. 0  I have been so unhappy that I have been crying. 0  The thought of harming myself has occurred to me. 0  Edinburgh Postnatal Depression Scale Total 1      After visit meds:  Allergies as of 05/15/2022   No Known Allergies      Medication List     STOP taking these medications    erythromycin ophthalmic ointment   ferrous sulfate 325 (65 FE) MG tablet   hydrocortisone-pramoxine rectal foam Commonly known as: PROCTOFOAM-HC   ondansetron 4 MG disintegrating tablet Commonly known as: ZOFRAN-ODT   Pazeo 0.7 % Soln Generic drug: Olopatadine HCl       TAKE these medications    levETIRAcetam 1000 MG tablet Commonly known as: KEPPRA Take 1 and 1/2 tablets twice  a day   multivitamin-prenatal 27-0.8 MG Tabs tablet Take 1 tablet by mouth daily at 12 noon.         Discharge home in stable condition Infant Feeding: Bottle Infant Disposition:home with mother Discharge instruction: per After Visit Summary and Postpartum booklet. Activity: Advance as tolerated. Pelvic rest for 6 weeks.  Diet: routine diet Anticipated Birth Control: Unsure Postpartum Appointment: 2 week telephone call and six week in person visit Additional Postpartum F/U:  none Future Appointments: Future Appointments  Date Time Provider Department Center  06/12/2022  3:30 PM Van Clines, MD [facility] None  06/15/2022 12:50 PM [facility] MR 2 [facility]   Follow up Visit:  Follow-up Information     Glenetta Borg, CNM. Schedule an appointment as soon as possible for a visit.   Specialty: Obstetrics Why: Video visit in 2 weeks Office visit in 6 weeks Contact information: [address] [place] [address] [phone]                     Raeford Razor 05/15/22 12:32 PM

## 2022-05-14 NOTE — H&P (Signed)
OB History & Physical   History of Present Illness:  Chief Complaint: contractions  HPI:  Gina Henry is a 32 y.o. G60P1001 female at [redacted]w[redacted]d dated by LMP.  Her pregnancy has been complicated by seizures, Rh negative state, scoliosis, varicose veins .    She reports contractions that have become closer and stronger since last night at 7:30.   She denies leakage of fluid.   She denies vaginal bleeding.   She reports fetal movement.    Total weight gain for pregnancy: 16.3 kg   Obstetrical Problem List: second Problems (from 10/06/21 to present)     Nursing Staff Provider  Office Location  [place] [facility] Dating  By LMP  Language  English Anatomy US    Flu Vaccine   Genetic Screen  NIPS:   TDaP vaccine    Hgb A1C or  GTT Early :  Third trimester : 28  Covid    LAB RESULTS   Rhogam   Blood Type A/Negative/-- (09/22 0841)   Feeding Plan  Antibody Negative (09/22 0841)  Contraception  Rubella 1.14 (09/22 0841)  Circumcision  RPR Non Reactive (01/26 0940)   Pediatrician   HBsAg Negative (09/22 0841)   Support Person  HIV Non Reactive (09/22 0841)  Prenatal Classes  Varicella     GBS Negative/-- (03/22 0905)(For PCN allergy, check sensitivities)   BTL Consent     VBAC Consent NA Pap  10/21/21 negative    Hgb Electro    Pelvis Tested 3062 g CF      SMA           Maternal Medical History:   Past Medical History:  Diagnosis Date   High cholesterol    Hyperlipidemia    Seizures 10/03/2021   first seizure was 07/11/2021    Past Surgical History:  Procedure Laterality Date   WISDOM TOOTH EXTRACTION     four; 2016/2017    No Known Allergies  Prior to Admission medications   Medication Sig Start Date End Date Taking? Authorizing Provider  levETIRAcetam (KEPPRA) 1000 MG tablet Take 1 and 1/2 tablets twice a day 04/22/22  Yes Van Clines, MD  Prenatal Vit-Fe Fumarate-FA (MULTIVITAMIN-PRENATAL) 27-0.8 MG TABS tablet Take 1 tablet by mouth daily at 12 noon.   Yes [provider]  erythromycin ophthalmic ointment Place a 1/2 inch ribbon of ointment into the lower eyelid of left eye 3 times daily x 5 days. 04/11/22   Crain, Whitney L, PA  ferrous sulfate 325 (65 FE) MG tablet Take 325 mg by mouth daily with breakfast.    [provider]  hydrocortisone-pramoxine (PROCTOFOAM-HC) rectal foam Place 1 applicator rectally 2 (two) times daily. 04/30/22   Mirna Mires, CNM  Olopatadine HCl (PAZEO) 0.7 % SOLN Apply 1 drop to eye daily. 04/11/22   Crain, Whitney L, PA  ondansetron (ZOFRAN-ODT) 4 MG disintegrating tablet Take 1 tablet (4 mg total) by mouth every 8 (eight) hours as needed for nausea or vomiting. 03/17/22   Tresea Mall, CNM    OB History  Gravida Para Term Preterm AB Living  SAB IAB Ectopic Multiple Live Births          1    # Outcome Date GA Lbr Len/2nd Weight Sex Delivery Anes PTL Lv  2 Current           1 Term 04/21/13 [redacted]w[redacted]d  3062 g M Vag-EUGENIE HAREWOODpont  N LIV  Prenatal care site: [facility]  Social History: She  reports that she has never smoked. She has never used smokeless tobacco. She reports that she does not currently use alcohol. She reports that she does not currently use drugs after having used the following drugs: Marijuana.  Family History: family history includes Cancer in her maternal grandfather; Colon cancer (age of onset: 48) in her father; High blood pressure in her maternal aunt; Hyperlipidemia in her maternal grandfather and maternal grandmother; Hypertension in her maternal grandfather and maternal grandmother; Stroke in her maternal grandmother; Varicose Veins in her mother.    Review of Systems:  Review of Systems  Constitutional:  Negative for chills and fever.  HENT:  Negative for congestion, ear discharge, ear pain, hearing loss, sinus pain and sore throat.   Eyes:  Negative for blurred vision and double vision.  Respiratory:  Negative for cough, shortness of breath and wheezing.    Cardiovascular:  Negative for chest pain, palpitations and leg swelling.  Gastrointestinal:  Positive for abdominal pain. Negative for blood in stool, constipation, diarrhea, heartburn, melena, nausea and vomiting.  Genitourinary:  Negative for dysuria, flank pain, frequency, hematuria and urgency.  Musculoskeletal:  Negative for back pain, joint pain and myalgias.  Skin:  Negative for itching and rash.  Neurological:  Negative for dizziness, tingling, tremors, sensory change, speech change, focal weakness, seizures, loss of consciousness, weakness and headaches.  Endo/Heme/Allergies:  Negative for environmental allergies. Does not bruise/bleed easily.  Psychiatric/Behavioral:  Negative for depression, hallucinations, memory loss, substance abuse and suicidal ideas. The patient is not nervous/anxious and does not have insomnia.      Physical Exam:  BP 103/76 (BP Location: Left Arm)   Pulse 67   Resp 18   Ht  (1.676 m)   Wt 84.4 kg   LMP 08/06/2021 (Exact Date)   BMI 30.02 kg/m   Constitutional: Well nourished, well developed female in no acute distress.  HEENT: normal Skin: Warm and dry.  Cardiovascular: Regular rate and rhythm.   Extremity:  no edema   Respiratory: Clear to auscultation bilateral. Normal respiratory effort Abdomen: FHT present Psych: Alert and Oriented x3. No memory deficits. Normal mood and affect.  MS: normal gait, normal bilateral lower extremity ROM/strength/stability.  Pelvic exam: per RN A. Rebelo  3/50/-2   Baseline FHR: 135 beats/min   Variability: moderate   Accelerations: present   Decelerations: absent Contractions: present frequency: every 2-4 Overall assessment: reassuring   No results found for: "SARSCOV2NAA"  Assessment:  Gina Henry is a 32 y.o. G103P1001 female at [redacted]w[redacted]d laboring.   Plan:  Admit to Labor & Delivery  CBC, T&S, Clrs, IVF GBS negative.   Fetal well-being: Category I Augment with cytotec   Tresea Mall,  CNM 05/14/2022 8:12 AM

## 2022-05-14 NOTE — Progress Notes (Signed)
LABOR NOTE   SUBJECTIVE:   Gina Henry is a 32 y.o.  G2P1001  at [redacted]w[redacted]d who presented in early labor. She is comfortable wit her epidural. She has made some cervical change from her last exam. She desires AROM.  Analgesia: Epidural  OBJECTIVE:  BP 112/63   Pulse 66   Temp 97.6 F (36.4 C) (Axillary)   Resp 18   Ht  (1.676 m)   Wt 84.4 kg   LMP 08/06/2021 (Exact Date)   SpO2 99%   BMI 30.02 kg/m  No intake/output data recorded.  SVE:   Dilation: 4 Effacement (%): 60 Station: -1 Exam by:: Gina Henry, CNM CONTRACTIONS: regular, every 2-4 minutes FHR: Fetal heart tracing reviewed. Baseline: 120 Variability: moderate Accelerations: present Decelerations:occasional lates Category 2  Labs: Lab Results  Component Value Date   WBC 18.1 (H) 05/14/2022   HGB 11.0 (L) 05/14/2022   HCT 33.9 (L) 05/14/2022   MCV 82.7 05/14/2022   PLT 307 05/14/2022    ASSESSMENT:     Spontaneous labor, progressing normally     Coping well., Good support.     Membranes: ruptured, clear/bloody  Principal Problem:   Post-dates pregnancy   PLAN: Early labor Membranes ruptures from scant amount of clear fluid Consider augmentation with Pitocin if indicated Anticipate NSVD   Gina Henry, CNM 05/14/2022 11:23 AM

## 2022-05-15 LAB — FETAL SCREEN: Fetal Screen: NEGATIVE

## 2022-05-15 LAB — RPR: RPR Ser Ql: NONREACTIVE

## 2022-05-15 LAB — RHOGAM INJECTION

## 2022-05-15 MED ORDER — RHO D IMMUNE GLOBULIN 1500 UNIT/2ML IJ SOSY
300.0000 ug | PREFILLED_SYRINGE | Freq: Once | INTRAMUSCULAR | Status: AC
  Administered 2022-05-15: 300 ug via INTRAVENOUS
  Filled 2022-05-15: qty 2

## 2022-05-15 NOTE — Discharge Instructions (Signed)
Discharge Instructions:   If there are any new medications, they have been ordered and will be available for pickup at the listed pharmacy on your way home from the hospital.   Call office if you have any of the following: headache, visual changes, fever >101.0 F, chills, shortness of breath, breast concerns, excessive vaginal bleeding, incision drainage or problems, leg pain or redness, depression or any other concerns. If you have vaginal discharge with an odor, let your doctor know.   It is normal to bleed for up to 6 weeks. You should not soak through more than 1 pad in 1 hour. If you have a blood clot larger than your fist with continued bleeding, call your doctor.   Activity: Do not lift > 10 lbs for 6 weeks (do not lift anything heavier than your baby). No intercourse, tampons, swimming pools, hot tubs, baths (only showers) for 6 weeks.  No driving for 1-2 weeks. Continue prenatal vitamin, especially if breastfeeding. Increase calories and fluids (water) while breastfeeding.   Your milk will come in, in the next couple of days (right now it is colostrum). You may have a slight fever when your milk comes in, but it should go away on its own.  If it does not, and rises above 101 F please call the doctor. You will also feel achy and your breasts will be firm. They will also start to leak. If you are breastfeeding, continue as you have been and you can pump/express milk for comfort.   If you have too much milk, your breasts can become engorged, which could lead to mastitis. This is an infection of the milk ducts. It can be very painful and you will need to notify your doctor to obtain a prescription for antibiotics. You can also treat it with a shower or hot/cold compress.   For concerns about your baby, please call your pediatrician.  For breastfeeding concerns, the lactation consultant can be reached at [phone].   Postpartum blues (feelings of happy one minute and sad another minute)  are normal for the first few weeks but if it gets worse let your doctor know.   Congratulations! We enjoyed caring for you and your new bundle of joy!  

## 2022-05-15 NOTE — Anesthesia Postprocedure Evaluation (Signed)
Anesthesia Post Note  Patient: Gina Henry  Procedure(s) Performed: AN AD HOC LABOR EPIDURAL  Patient location during evaluation: Mother Baby Anesthesia Type: Epidural Level of consciousness: awake and alert Pain management: pain level controlled Vital Signs Assessment: post-procedure vital signs reviewed and stable Respiratory status: spontaneous breathing, nonlabored ventilation and respiratory function stable Cardiovascular status: stable Postop Assessment: no headache, no backache and epidural receding Anesthetic complications: no   No notable events documented.   Last Vitals:  Vitals:   05/14/22 2327 05/15/22 0329  BP: 106/67 (!) 91/59  Pulse: (!) 53 67  Resp: 18 18  Temp: 36.6 C 36.6 C  SpO2: 98% 97%    Last Pain:  Vitals:   05/15/22 0426  TempSrc:   PainSc: 0-No pain                 Lecretia Buczek Joanette Gula

## 2022-05-16 LAB — RHOGAM INJECTION: Unit division: 0

## 2022-05-18 ENCOUNTER — Other Ambulatory Visit: Payer: 59

## 2022-06-01 ENCOUNTER — Telehealth (INDEPENDENT_AMBULATORY_CARE_PROVIDER_SITE_OTHER): Payer: 59 | Admitting: Obstetrics

## 2022-06-01 ENCOUNTER — Encounter: Payer: Self-pay | Admitting: Obstetrics

## 2022-06-01 NOTE — Progress Notes (Signed)
Virtual Visit via Video Note  I connected with Gina Henry on 06/01/22 at  3:55 PM EDT by a video enabled telemedicine application and verified that I am speaking with the correct person using two identifiers.  Location: Patient: home Provider: office   I discussed the limitations of evaluation and management by telemedicine and the availability of in person appointments. The patient expressed understanding and agreed to proceed.  History of Present Illness: Gina Henry is a 32 y.o. [id number] who is s/p NSVB on 05/14/22 at [redacted]w[redacted]d.  She had epidural anesthesia. Her perineum was intact. She is bottlefeeding. She was diagnosed with a seizure disorder during pregnancy and is on Keppra. She has a f/u visit with her neurologist scheduled for 06/12/22 and an MRI on 06/15/22.  Observations/Objective: Mood: depression screen negative. She feels a little off with her current dose of Keppra and is hoping for an adjustment when she sees her neurologist.  Gina Henry Postnatal Depression Scale - 06/01/22 1556       Edinburgh Postnatal Depression Scale:  In the Past 7 Days   I have been able to laugh and see the funny side of things. 0    I have looked forward with enjoyment to things. 0    I have blamed myself unnecessarily when things went wrong. 1    I have been anxious or worried for no good reason. 0    I have felt scared or panicky for no good reason. 0    Things have been getting on top of me. 0    I have been so unhappy that I have had difficulty sleeping. 0    I have felt sad or miserable. 0    I have been so unhappy that I have been crying. 1    The thought of harming myself has occurred to me. 0    Edinburgh Postnatal Depression Scale Total 2            Pain: Had back pain the first few days after birth but that has resolved Appetite: eating and drinking plenty Bowel: normal Bladder: normal Bleeding: spotting only Perineum: healing well Baby: growing, doing  well  Assessment and Plan: 2-week PP visit Normal recovery Anticipatory guidance about the postpartum period Still deciding about contraception Office visit at 6 weeks PP  Follow Up Instructions:    I discussed the assessment and treatment plan with the patient. The patient was provided an opportunity to ask questions and all were answered. The patient agreed with the plan and demonstrated an understanding of the instructions.   The patient was advised to call back or seek an in-person evaluation if the symptoms worsen or if the condition fails to improve as anticipated.  I provided 6 minutes of non-face-to-face time during this encounter.   Glenetta Borg, CNM

## 2022-06-07 ENCOUNTER — Emergency Department
Admission: EM | Admit: 2022-06-07 | Discharge: 2022-06-07 | Disposition: A | Discharge status: Home / Self Care | Payer: 59 | Attending: Emergency Medicine | Admitting: Emergency Medicine

## 2022-06-07 ENCOUNTER — Other Ambulatory Visit: Payer: Self-pay

## 2022-06-07 ENCOUNTER — Encounter: Payer: Self-pay | Admitting: Emergency Medicine

## 2022-06-07 DIAGNOSIS — R569 Unspecified convulsions: Secondary | ICD-10-CM | POA: Diagnosis present

## 2022-06-07 LAB — COMPREHENSIVE METABOLIC PANEL
ALT: 17 U/L (ref 0–44)
AST: 23 U/L (ref 15–41)
Albumin: 3.5 g/dL (ref 3.5–5.0)
Alkaline Phosphatase: 447 U/L — ABNORMAL HIGH (ref 38–126)
Anion gap: 6 (ref 5–15)
BUN: 9 mg/dL (ref 6–20)
CO2: 24 mmol/L (ref 22–32)
Calcium: 8.9 mg/dL (ref 8.9–10.3)
Chloride: 106 mmol/L (ref 98–111)
Creatinine, Ser: 0.79 mg/dL (ref 0.44–1.00)
GFR, Estimated: >60 mL/min (ref >60)
Glucose, Bld: 90 mg/dL (ref 70–99)
Potassium: 4 mmol/L (ref 3.5–5.1)
Sodium: 136 mmol/L (ref 135–145)
Total Bilirubin: 0.5 mg/dL (ref 0.3–1.2)
Total Protein: 7.3 g/dL (ref 6.5–8.1)

## 2022-06-07 LAB — CBC WITH DIFFERENTIAL/PLATELET
Abs Immature Granulocytes: 0.04 10*3/uL (ref 0.00–0.07)
Basophils Absolute: 0 10*3/uL (ref 0.0–0.1)
Basophils Relative: 0 %
Eosinophils Absolute: 0.1 10*3/uL (ref 0.0–0.5)
Eosinophils Relative: 1 %
HCT: 41.1 % (ref 36.0–46.0)
Hemoglobin: 13.1 g/dL (ref 12.0–15.0)
Immature Granulocytes: 1 %
Lymphocytes Relative: 20 %
Lymphs Abs: 1.6 10*3/uL (ref 0.7–4.0)
MCH: 26.5 pg (ref 26.0–34.0)
MCHC: 31.9 g/dL (ref 30.0–36.0)
MCV: 83 fL (ref 80.0–100.0)
Monocytes Absolute: 0.5 10*3/uL (ref 0.1–1.0)
Monocytes Relative: 7 %
Neutro Abs: 5.7 10*3/uL (ref 1.7–7.7)
Neutrophils Relative %: 71 %
Platelets: 448 10*3/uL — ABNORMAL HIGH (ref 150–400)
RBC: 4.95 MIL/uL (ref 3.87–5.11)
RDW: 14.9 % (ref 11.5–15.5)
WBC: 8 10*3/uL (ref 4.0–10.5)
nRBC: 0 % (ref 0.0–0.2)

## 2022-06-07 MED ORDER — LEVETIRACETAM IN NACL 1500 MG/100ML IV SOLN
1500.0000 mg | Freq: Once | INTRAVENOUS | Status: AC
  Administered 2022-06-07: 1500 mg via INTRAVENOUS
  Filled 2022-06-07: qty 100

## 2022-06-07 NOTE — ED Provider Notes (Signed)
[facility] Provider Note    Event Date/Time   First MD Initiated Contact with Patient 06/07/22 1305     (approximate)   History   Chief Complaint Seizures   HPI  Gina Henry is a 32 y.o. female with past medical history of seizures who presents to the ED following suspected seizure.  Patient reports that she remembers going to the bathroom this morning but the next thing that she remembers was waking up in bed.  She did not have any witnessed seizure activity, however her spouse reportedly found her down on the floor in the bathroom and confused like she might have had a seizure.  Patient reports that she bit her tongue but denies any urinary incontinence, does report initially feeling confused similar to prior seizures but is now feeling more back to normal.  She reports otherwise feeling well recently with no fevers, cough, chest pain, shortness of breath, nausea, vomiting, or diarrhea.  She usually takes Keppra 1500 mg twice daily, did not yet have her morning dose today but had otherwise been taking the medication as usual.  She is about 3 weeks postpartum, denies any complications with her delivery.     Physical Exam   Triage Vital Signs: ED Triage Vitals  Enc Vitals Group     BP      Pulse      Resp      Temp      Temp src      SpO2      Weight      Height      Head Circumference      Peak Flow      Pain Score      Pain Loc      Pain Edu?      Excl. in GC?     Most recent vital signs: Vitals:   06/07/22 1430 06/07/22 1500  BP: 104/76 108/78  Pulse: 79 81  Resp: 19 17  SpO2: 100% 100%    Constitutional: Alert and oriented. Eyes: Conjunctivae are normal. Head: Atraumatic. Nose: No congestion/rhinnorhea. Mouth/Throat: Mucous membranes are moist.  Cardiovascular: Tachycardic, regular rhythm. Grossly normal heart sounds.  2+ radial pulses bilaterally. Respiratory: Normal respiratory effort.  No retractions. Lungs  CTAB. Gastrointestinal: Soft and nontender. No distention. Musculoskeletal: No lower extremity tenderness nor edema.  Neurologic:  Normal speech and language. No gross focal neurologic deficits are appreciated.    ED Results / Procedures / Treatments   Labs (all labs ordered are listed, but only abnormal results are displayed) Labs Reviewed  CBC WITH DIFFERENTIAL/PLATELET - Abnormal; Notable for the following components:      Result Value   Platelets 448 (*)    All other components within normal limits  COMPREHENSIVE METABOLIC PANEL - Abnormal; Notable for the following components:   Alkaline Phosphatase 447 (*)    All other components within normal limits     EKG  ED ECG REPORT I, Chesley Noon, the attending physician, personally viewed and interpreted this ECG.   Date: 06/07/2022  EKG Time: 13:13  Rate: 111  Rhythm: sinus tachycardia  Axis: RAD  Intervals:none  ST&T Change: None  PROCEDURES:  Critical Care performed: No  Procedures   MEDICATIONS ORDERED IN ED: Medications  levETIRAcetam (KEPPRA) IVPB 1500 mg/ 100 mL premix (0 mg Intravenous Stopped 06/07/22 1353)     IMPRESSION / MDM / ASSESSMENT AND PLAN / ED COURSE  I reviewed the triage vital signs and the nursing  notes.                              32 y.o. female with past medical history of hyperlipidemia and seizures who presents to the ED following suspected seizure episode when she was found down in the bathroom confused and having bit her tongue.  Patient's presentation is most consistent with acute presentation with potential threat to life or bodily function.  Differential diagnosis includes, but is not limited to, seizure, syncope, arrhythmia, anemia, electrolyte abnormality, AKI, medication noncompliance.  Patient nontoxic-appearing and in no acute distress, vital signs remarkable for tachycardia but otherwise reassuring.  While patient is only 3 weeks postpartum, I doubt eclampsia given she  has a normal blood pressure and has a history of prior seizures.  EKG shows sinus tachycardia with no ischemic changes, lab results are pending at this time.  She has no signs of significant trauma to her head or neck and has a nonfocal neurologic exam, do not feel CT imaging indicated at this time.  We will give her usual home dose of Keppra and observe here in the ED.  Labs are reassuring with no significant anemia, leukocytosis, lecture abnormality, or AKI.  Patient with no further seizure episodes here in the ED, remains awake and alert without any complaints on reassessment.  She is appropriate for discharge home, counseled to continue her usual dose of Keppra and schedule a follow-up appointment with her neurologist.  She is also requested referral to neurologist here in [place], which was provided.  She was counseled to return to the ED for new or worsening symptoms, patient and family agree with plan.      FINAL CLINICAL IMPRESSION(S) / ED DIAGNOSES   Final diagnoses:  Seizure (HCC)     Rx / DC Orders   ED Discharge Orders     None        Note:  This document was prepared using Dragon voice recognition software and may include unintentional dictation errors.   Chesley Noon, MD 06/07/22 [phone]

## 2022-06-07 NOTE — ED Triage Notes (Signed)
Pt via EMS from home. Pt had an unwitnessed seizure, pt has hx of seizure and missed her morning dose of Keppra. Pt states that her husband found her on the bathroom floor. On EMS arrival, pt is A&OX4 and NAD.

## 2022-06-08 ENCOUNTER — Encounter: Payer: Self-pay | Admitting: Neurology

## 2022-06-10 ENCOUNTER — Telehealth: Payer: Self-pay

## 2022-06-12 ENCOUNTER — Ambulatory Visit: Payer: 59 | Admitting: Neurology

## 2022-06-12 ENCOUNTER — Encounter: Payer: Self-pay | Admitting: Neurology

## 2022-06-12 VITALS — BP 104/73 | HR 90 | Ht 66.0 in | Wt 171.8 lb

## 2022-06-12 DIAGNOSIS — G40009 Localization-related (focal) (partial) idiopathic epilepsy and epileptic syndromes with seizures of localized onset, not intractable, without status epilepticus: Secondary | ICD-10-CM | POA: Diagnosis not present

## 2022-06-12 MED ORDER — LEVETIRACETAM 1000 MG PO TABS: ORAL_TABLET | ORAL | 6 refills | Status: DC

## 2022-06-12 MED ORDER — LAMOTRIGINE 25 MG PO TABS: ORAL_TABLET | ORAL | 6 refills | Status: DC
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
## 2022-06-15 ENCOUNTER — Ambulatory Visit
Admission: RE | Admit: 2022-06-15 | Discharge: 2022-06-15 | Disposition: A | Discharge status: Home / Self Care | Payer: 59 | Source: Ambulatory Visit | Attending: Neurology | Admitting: Neurology

## 2022-06-15 DIAGNOSIS — R569 Unspecified convulsions: Secondary | ICD-10-CM

## 2022-06-15 MED ORDER — GADOPICLENOL 0.5 MMOL/ML IV SOLN
8.0000 mL | Freq: Once | INTRAVENOUS | Status: AC | PRN
  Administered 2022-06-15: 8 mL via INTRAVENOUS

## 2022-06-16 ENCOUNTER — Telehealth: Payer: Self-pay

## 2022-06-16 NOTE — Telephone Encounter (Signed)
Pt called left a voice mail per DPR that brain MRI was fine, no tumor, stroke, or bleed seen

## 2022-06-16 NOTE — Telephone Encounter (Signed)
-----   Message from Van Clines, MD sent at 06/16/2022  9:25 AM EDT ----- Pls let her know the brain MRI was fine, no tumor, stroke, or bleed seen. Thanks

## 2022-06-19 ENCOUNTER — Telehealth: Payer: Self-pay | Admitting: *Deleted

## 2022-06-23 ENCOUNTER — Encounter: Payer: Self-pay | Admitting: Neurology

## 2022-06-25 ENCOUNTER — Ambulatory Visit (INDEPENDENT_AMBULATORY_CARE_PROVIDER_SITE_OTHER): Payer: 59 | Admitting: Obstetrics

## 2022-06-25 ENCOUNTER — Encounter: Payer: Self-pay | Admitting: Obstetrics

## 2022-06-25 DIAGNOSIS — Z30017 Encounter for initial prescription of implantable subdermal contraceptive: Secondary | ICD-10-CM

## 2022-06-25 DIAGNOSIS — Z975 Presence of (intrauterine) contraceptive device: Secondary | ICD-10-CM

## 2022-06-25 MED ORDER — ETONOGESTREL 68 MG ~~LOC~~ IMPL
68.0000 mg | DRUG_IMPLANT | Freq: Once | SUBCUTANEOUS | Status: AC
Start: 2022-06-25 — End: 2022-06-25
  Administered 2022-06-25: 68 mg via SUBCUTANEOUS

## 2022-06-25 NOTE — Progress Notes (Signed)
Post Partum Visit Note  Gina Henry is a 32 y.o. G68P2002 female who presents for a postpartum visit. She is 6 weeks postpartum following a normal spontaneous vaginal delivery.  I have fully reviewed the prenatal and intrapartum course and was present at the birth. The delivery was at 40.1 gestational weeks.  Anesthesia: epidural. Postpartum course has been complicated by seizures. She has had 2 seizures since the birth of her baby. 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 [name] is doing well. Baby is feeding by  bottle . Bleeding: menses have resumed. Bowel function is normal. Bladder function is normal. Patient is not sexually active. Desires Nexplanon for contraception. Postpartum depression screening: negative.   The pregnancy intention screening data noted above was reviewed. Potential methods of contraception were discussed. The patient elected to proceed with No data recorded.    Health Maintenance Due  Topic Date Due   COVID-19 Vaccine (3 - Pfizer risk series) 06/10/2019    The following portions of the patient's history were reviewed and updated as appropriate: allergies, current medications, past family history, past medical history, past social history, past surgical history, and problem list.  Review of Systems Pertinent items noted in HPI and remainder of comprehensive ROS otherwise negative.  Objective:  LMP 08/06/2021 (Exact Date)    General:  alert, cooperative, and appears stated age   Breasts:  normal  Lungs: clear to auscultation bilaterally  Heart:  regular rate and rhythm, S1, S2 normal, no murmur, click, rub or gallop  Abdomen: soft, non-tender; bowel sounds normal; no masses,  no organomegaly   Wound: N/A  GU exam:  not indicated       BP 95/67   Pulse 87   Ht 5\' 7"  (1.702 m)   Wt 173 lb (78.5 kg)   LMP 06/21/2022 (Exact Date)   Breastfeeding No   BMI 27.10 kg/m    Procedure  note:  Nexplanon insertion Gina Henry desires reversible long-term contraception. We have thoroughly reviewed the risks, benefits, and alternatives, and she has elected to proceed with Nexplanon insertion.    Procedure Note Left Arm Sterile Preparation:  Betadine  Insertion site was selected 8 cm from the medial epicondyle. The procedure area was prepped in sterile fashion. Adequate anesthesia was achieved with 2 mL of 1% lidocaine injected subcutaneously. The Nexplanon applicator was inserted subcutaneously and the Nexplanon device was delivered. The applicator was removed from the insertion site. The capsule was palpated by myself and the patient to confirm satisfactory placement. Blood loss was minimal. A pressure dressing was applied.  [name] tolerated the procedure well with no complications. Standard post-procedure care and return precautions were explained.    Assessment:   6 weeks postpartum Normal postpartum exam Nexplanon insertion   Plan:   Essential components of care per ACOG recommendations:  1.  Mood and well being: Patient with negative depression screening today. Reviewed local resources for support.  - Patient tobacco use? No.   - hx of drug use? No.    2. Infant care and feeding:  -Patient currently breastmilk feeding? No.  -Social determinants of health (SDOH) reviewed in EPIC. No concerns. 3. Sexuality, contraception and birth spacing - Patient does not want a pregnancy in the next year.  Desired family size is 2 children.  - Reviewed reproductive life planning. Reviewed contraceptive methods based on pt preferences and effectiveness.  Patient desired Hormonal Implant today.   - Discussed birth spacing  of 18 months  4. Sleep and fatigue -Encouraged family/partner/community support of 4 hrs of uninterrupted sleep to help with mood and fatigue  5. Physical Recovery  - Discussed delivery and complications. She describes her labor as good. - Patient had an   NSVD with an intact perineum. Perineal healing reviewed. Patient expressed understanding - Patient has urinary incontinence? No. - Patient is not safe to resume physical and sexual activity  6.  Health Maintenance - HM due items addressed Yes - Last pap smear  Diagnosis  Date Value Ref Range Status  10/21/2021   Final   - Negative for intraepithelial lesion or malignancy (NILM)   Pap smear not done at today's visit.  -Breast Cancer screening indicated? No.   7. Chronic Disease/Pregnancy Condition follow up:  seizure disorder  - PCP follow up  Gina Henry, CNM [facility] [place], [facility]

## 2022-07-08 ENCOUNTER — Ambulatory Visit (INDEPENDENT_AMBULATORY_CARE_PROVIDER_SITE_OTHER): Payer: 59 | Admitting: Neurology

## 2022-07-08 DIAGNOSIS — G40009 Localization-related (focal) (partial) idiopathic epilepsy and epileptic syndromes with seizures of localized onset, not intractable, without status epilepticus: Secondary | ICD-10-CM

## 2022-07-10 NOTE — Progress Notes (Signed)
AMB EEG discontinued. No skin breakdown at [facility]. Log returned.

## 2022-07-24 ENCOUNTER — Encounter: Payer: Self-pay | Admitting: Neurology

## 2022-08-07 ENCOUNTER — Telehealth: Payer: Self-pay | Admitting: Neurology

## 2022-08-07 NOTE — Telephone Encounter (Signed)
Pt is calling in to get her EEG results from June 2024.  Pt would like to have a call back.

## 2022-08-10 NOTE — Telephone Encounter (Signed)
Pt is calling to check the status and she is aware that we are awaiting for Dr. Karel Jarvis to result it.

## 2022-08-11 NOTE — Telephone Encounter (Signed)
Left second VM at 4pm, asked patient to call back with best number and time to call her back

## 2022-08-11 NOTE — Procedures (Signed)
ELECTROENCEPHALOGRAM REPORT  Dates of Recording: 07/08/2022 10:30AM to 07/10/2022 11:06AM  Patient's Name: Gina Henry MRN: [id number] Date of Birth: [birth year]  Referring Provider: Dr. Patrcia Dolly  Procedure: 48-hour ambulatory video EEG  History: This is a 32 year old woman with recurrent seizures. EEG for classification  Medications: Keppra, Lamotrigine  Technical Summary: This is a 48-hour multichannel digital video EEG recording measured by the international 10-20 system with electrodes applied with paste and impedances below 5000 ohms performed as portable with EKG monitoring.  The digital EEG was referentially recorded, reformatted, and digitally filtered in a variety of bipolar and referential montages for optimal display.    DESCRIPTION OF RECORDING: During maximal wakefulness, the background activity consisted of a symmetric 9.5 Hz posterior dominant rhythm which was reactive to eye opening.  There were no epileptiform discharges or focal slowing seen in wakefulness.  During the recording, the patient progresses through wakefulness, drowsiness, and Stage 2 sleep.  There were occasional broad sharp waves seen over the left frontotemporal region in sleep.   Events: There were several push button events, those with video available appear accidental. Patient reported a headache but did not indicate time. Electrographically, there were no EEG or EKG changes seen.  There were no electrographic seizures seen.  EKG lead was unremarkable.  IMPRESSION: This 48-hour ambulatory video EEG study is abnormal due to occasional left frontotemporal epileptiform discharges seen exclusively in sleep  CLINICAL CORRELATION of the above findings indicates a tendency for seizure to arise from the left frontotemporal region. If further clinical questions remain, inpatient video EEG monitoring may be helpful.   Patrcia Dolly, M.D.

## 2022-08-11 NOTE — Telephone Encounter (Signed)
Left VM to discuss EEG results. 

## 2022-08-12 ENCOUNTER — Encounter: Payer: Self-pay | Admitting: Neurology

## 2022-08-12 ENCOUNTER — Telehealth: Payer: Self-pay | Admitting: Neurology

## 2022-08-12 DIAGNOSIS — G40009 Localization-related (focal) (partial) idiopathic epilepsy and epileptic syndromes with seizures of localized onset, not intractable, without status epilepticus: Secondary | ICD-10-CM

## 2022-08-12 NOTE — Telephone Encounter (Signed)
Called again, no answer, left VM to call back with number and time I can try to reach her.

## 2022-08-12 NOTE — Telephone Encounter (Signed)
Patient is returning phone call, Patient received a Mychart message from Dr. Karel Jarvis, patient is requesting a call back.Gina Henry

## 2022-08-13 ENCOUNTER — Encounter: Payer: Self-pay | Admitting: Neurology

## 2022-08-13 NOTE — Telephone Encounter (Signed)
Called patient, no answer, left VM

## 2022-08-14 ENCOUNTER — Encounter: Payer: Self-pay | Admitting: Neurology

## 2022-08-14 MED ORDER — LAMOTRIGINE 100 MG PO TABS: 100.0000 mg | ORAL_TABLET | Freq: Two times a day (BID) | ORAL | 6 refills | Status: DC

## 2022-08-14 NOTE — Telephone Encounter (Signed)
Called, no answer, left VM.

## 2022-08-14 NOTE — Telephone Encounter (Signed)
Discussed EEG showing occasional left frontotemporal epileptiform discharges. She has been doing well, we discussed going to Lamotrigine 100mg  BID, continue Levetiracetam 1500mg  BID. All her questions were answered, f/u as scheduled next month.

## 2022-09-15 ENCOUNTER — Ambulatory Visit: Payer: 59 | Admitting: Neurology

## 2022-09-15 ENCOUNTER — Encounter: Payer: Self-pay | Admitting: Neurology

## 2022-09-15 DIAGNOSIS — Z029 Encounter for administrative examinations, unspecified: Secondary | ICD-10-CM

## 2022-11-23 ENCOUNTER — Ambulatory Visit (INDEPENDENT_AMBULATORY_CARE_PROVIDER_SITE_OTHER): Payer: 59 | Admitting: Cardiology

## 2022-11-23 ENCOUNTER — Encounter: Payer: Self-pay | Admitting: Cardiology

## 2022-11-23 VITALS — BP 110/70 | HR 58 | Ht 66.0 in | Wt 189.8 lb

## 2022-11-23 DIAGNOSIS — R569 Unspecified convulsions: Secondary | ICD-10-CM | POA: Diagnosis not present

## 2022-11-23 DIAGNOSIS — Z131 Encounter for screening for diabetes mellitus: Secondary | ICD-10-CM

## 2022-11-23 DIAGNOSIS — E782 Mixed hyperlipidemia: Secondary | ICD-10-CM | POA: Insufficient documentation

## 2022-11-23 DIAGNOSIS — B3731 Acute candidiasis of vulva and vagina: Secondary | ICD-10-CM | POA: Diagnosis not present

## 2022-11-23 DIAGNOSIS — Z1211 Encounter for screening for malignant neoplasm of colon: Secondary | ICD-10-CM

## 2022-11-23 MED ORDER — WEGOVY 0.25 MG/0.5ML ~~LOC~~ SOAJ: 0.2500 mg | SUBCUTANEOUS | 3 refills | Status: DC

## 2022-11-23 MED ORDER — FLUCONAZOLE 150 MG PO TABS: ORAL_TABLET | ORAL | 2 refills | Status: DC

## 2022-11-23 NOTE — Progress Notes (Signed)
Established Patient Office Visit  Subjective:  Patient ID: Gina Henry, female    DOB: 13-Jul-1990  Age: 32 y.o. MRN: [id number]  Chief Complaint  Patient presents with   Follow-up    F/U    Patient in office for overdue follow up. Patient last seen in office in 06/2021. Patient not fasting today. Will return for fasting lab work.  Last pap smear 07/2020, negative.  Patient complaining of frequent yeast infections, concerned it could be BV. Patient has been using OTC treatments. Will have patient self swab today. Will send in Diflucan.  Patient requesting medication to aid in weight loss. Will send in [facility]. Patient to follow up 4 weeks after starting medication.  Patient following with neurology for her seizures.     No other concerns at this time.   Past Medical History:  Diagnosis Date   High cholesterol    Hyperlipidemia    Seizures (HCC) 10/03/2021   first seizure was 07/11/2021    Past Surgical History:  Procedure Laterality Date   WISDOM TOOTH EXTRACTION     four; 2016/2017    Social History   Socioeconomic History   Marital status: Single    Spouse name: Not on file   Number of children: 1   Years of education: 14   Highest education level: Not on file  Occupational History   Occupation: Sales executive  Tobacco Use   Smoking status: Never   Smokeless tobacco: Never  Vaping Use   Vaping status: Never Used  Substance and Sexual Activity   Alcohol use: Yes    Comment: occassionally   Drug use: Not Currently    Types: Marijuana   Sexual activity: Yes    Partners: Male    Birth control/protection: None  Other Topics Concern   Not on file  Social History Narrative   Right handed    Lives with husband one level    Caffeine 1-2 cups daily   Work Dealer office   Social Determinants of Health   Financial Resource Strain: Low Risk  (10/06/2021)   Overall Financial Resource Strain (CARDIA)    Difficulty of Paying Living Expenses: Not very hard   Food Insecurity: No Food Insecurity (05/14/2022)   Hunger Vital Sign    Worried About Running Out of Food in the Last Year: Never true    Ran Out of Food in the Last Year: Never true  Transportation Needs: No Transportation Needs (05/14/2022)   PRAPARE - Administrator, Civil Service (Medical): No    Lack of Transportation (Non-Medical): No  Physical Activity: Insufficiently Active (10/06/2021)   Exercise Vital Sign    Days of Exercise per Week: 3 days    Minutes of Exercise per Session: 30 min  Stress: No Stress Concern Present (10/06/2021)   [employer] of Occupational Health - Occupational Stress Questionnaire    Feeling of Stress : Not at all  Social Connections: Moderately Integrated (10/06/2021)   Social Connection and Isolation Panel [NHANES]    Frequency of Communication with Friends and Family: More than three times a week    Frequency of Social Gatherings with Friends and Family: Not on file    Attends Religious Services: More than 4 times per year    Active Member of [employer] or Organizations: No    Attends Banker Meetings: Never    Marital Status: Living with partner  Intimate Partner Violence: Not At Risk (05/14/2022)   Humiliation, Afraid, Rape, and Kick questionnaire  Fear of Current or Ex-Partner: No    Emotionally Abused: No    Physically Abused: No    Sexually Abused: No    Family History  Problem Relation Age of Onset   Varicose Veins Mother    Colon cancer Father 70       died at 70   Hypertension Maternal Grandmother    Hyperlipidemia Maternal Grandmother    Stroke Maternal Grandmother    Hypertension Maternal Grandfather    Hyperlipidemia Maternal Grandfather    Cancer Maternal Grandfather    High blood pressure Maternal Aunt     No Known Allergies  Review of Systems  Constitutional: Negative.   HENT: Negative.    Eyes: Negative.   Respiratory: Negative.  Negative for shortness of breath.   Cardiovascular: Negative.   Negative for chest pain.  Gastrointestinal: Negative.  Negative for abdominal pain, constipation and diarrhea.  Genitourinary: Negative.   Musculoskeletal:  Negative for joint pain and myalgias.  Skin: Negative.   Neurological: Negative.  Negative for dizziness and headaches.  Endo/Heme/Allergies: Negative.   All other systems reviewed and are negative.      Objective:   BP 110/70   Pulse (!) 58   Ht 5\' 6"  (1.676 m)   Wt 189 lb 12.8 oz (86.1 kg)   SpO2 98%   BMI 30.63 kg/m   Vitals:   11/23/22 1002  BP: 110/70  Pulse: (!) 58  Height: 5\' 6"  (1.676 m)  Weight: 189 lb 12.8 oz (86.1 kg)  SpO2: 98%  BMI (Calculated): 30.65    Physical Exam Vitals and nursing note reviewed.  Constitutional:      Appearance: Normal appearance. She is normal weight.  HENT:     Head: Normocephalic and atraumatic.     Nose: Nose normal.     Mouth/Throat:     Mouth: Mucous membranes are moist.  Eyes:     Extraocular Movements: Extraocular movements intact.     Conjunctiva/sclera: Conjunctivae normal.     Pupils: Pupils are equal, round, and reactive to light.  Cardiovascular:     Rate and Rhythm: Normal rate and regular rhythm.     Pulses: Normal pulses.     Heart sounds: Normal heart sounds.  Pulmonary:     Effort: Pulmonary effort is normal.     Breath sounds: Normal breath sounds.  Abdominal:     General: Abdomen is flat. Bowel sounds are normal.     Palpations: Abdomen is soft.  Musculoskeletal:        General: Normal range of motion.     Cervical back: Normal range of motion.  Skin:    General: Skin is warm and dry.  Neurological:     General: No focal deficit present.     Mental Status: She is alert and oriented to person, place, and time.  Psychiatric:        Mood and Affect: Mood normal.        Behavior: Behavior normal.        Thought Content: Thought content normal.        Judgment: Judgment normal.      No results found for any visits on 11/23/22.  No results  found for this or any previous visit (from the past 2160 hour(s)).    Assessment & Plan:  Return for fasting lab work.  Self swab today, will call with results.  Diflucan to treat yeast infection.  Wegovy for weight loss.  Keep follow up appointments with neurology.  Problem List Items Addressed This Visit       Other   Seizures (HCC)   Mixed hyperlipidemia - Primary   Other Visit Diagnoses     Colon cancer screening       Relevant Orders   Ambulatory referral to Gastroenterology       Return in about 6 weeks (around 01/04/2023).   Total time spent: 25 minutes  [employer], NP  11/23/2022   This document may have been prepared by Dragon Voice Recognition software and as such may include unintentional dictation errors.

## 2022-11-26 LAB — NUSWAB VAGINITIS PLUS (VG+)
Atopobium vaginae: HIGH {score} — AB
Candida albicans, NAA: POSITIVE — AB
Candida glabrata, NAA: NEGATIVE
Chlamydia trachomatis, NAA: NEGATIVE
Neisseria gonorrhoeae, NAA: NEGATIVE
Trich vag by NAA: NEGATIVE

## 2022-11-27 NOTE — Progress Notes (Signed)
Sent message via mychart

## 2023-01-04 ENCOUNTER — Ambulatory Visit: Payer: 59 | Admitting: Cardiology

## 2023-02-11 ENCOUNTER — Ambulatory Visit: Payer: 59 | Admitting: Obstetrics & Gynecology

## 2023-02-11 VITALS — BP 103/55 | HR 81 | Ht 66.0 in | Wt 200.5 lb

## 2023-02-11 DIAGNOSIS — Z3046 Encounter for surveillance of implantable subdermal contraceptive: Secondary | ICD-10-CM | POA: Diagnosis not present

## 2023-02-11 NOTE — Progress Notes (Signed)
    GYNECOLOGY PROGRESS NOTE  Subjective:    Patient ID: Gina Henry, female    DOB: 01/02/1991, 33 y.o.   MRN: [id number]  HPI  Patient is a 33 y.o. G2P2002 ( 9 month and 19 yo kids) here to have Nexplanon removed. This was placed at her 6 week postpartum visit. She has a lot of irregular bleeding and she is not interested in continuing the Nexplanon at all. She used withdrawal between her 2 kids and plans to use that as her contraceptive method.   The following portions of the patient's history were reviewed and updated as appropriate: allergies, current medications, past family history, past medical history, past social history, past surgical history, and problem list.  Review of Systems Pertinent items are noted in HPI.  Pap was normal 2023.  Objective:   Blood pressure (!) 103/55, pulse 81, height 5\' 6"  (1.676 m), weight 200 lb 8 oz (90.9 kg), last menstrual period 01/16/2023, not currently breastfeeding. Body mass index is 32.36 kg/m. Well nourished, well hydrated  female, no apparent distress She is ambulating and conversing normally. Consent was signed and time out was done. Her left arm was prepped with betadine after establishing the position of the Nexplanon. The area was infiltrated with 2 cc of 1% lidocaine. A small incision was made and the intact rod was easily removed and noted to be intact.  A steristrip was placed and her arm was noted to be hemostatic. It was bandaged.  She tolerated the procedure well.     Assessment:   1. Encounter for Nexplanon removal      Plan:   1. Encounter for Nexplanon removal (Primary) - she plans withdrawal for contraception.

## 2023-03-12 ENCOUNTER — Telehealth: Payer: Self-pay | Admitting: Neurology

## 2023-03-12 MED ORDER — LAMOTRIGINE 100 MG PO TABS: 100.0000 mg | ORAL_TABLET | Freq: Two times a day (BID) | ORAL | 6 refills | Status: DC

## 2023-03-12 NOTE — Telephone Encounter (Signed)
Refill sent in for pt.

## 2023-03-12 NOTE — Telephone Encounter (Signed)
1. Which medications need refilled? (List name and dosage, if known) LAMOTRIGINE - she is out  2. Which pharmacy/location is medication to be sent to? (include street and city if local pharmacy) [facility] [address]

## 2023-09-09 ENCOUNTER — Other Ambulatory Visit: Payer: Self-pay | Admitting: Neurology

## 2023-10-08 ENCOUNTER — Encounter: Payer: Self-pay | Admitting: Neurology

## 2023-10-08 ENCOUNTER — Ambulatory Visit (INDEPENDENT_AMBULATORY_CARE_PROVIDER_SITE_OTHER): Payer: 59 | Admitting: Neurology

## 2023-10-08 VITALS — BP 115/78 | HR 84 | Ht 67.0 in | Wt 196.0 lb

## 2023-10-08 DIAGNOSIS — G40009 Localization-related (focal) (partial) idiopathic epilepsy and epileptic syndromes with seizures of localized onset, not intractable, without status epilepticus: Secondary | ICD-10-CM | POA: Diagnosis not present

## 2023-10-08 MED ORDER — LAMOTRIGINE 100 MG PO TABS: 100.0000 mg | ORAL_TABLET | Freq: Two times a day (BID) | ORAL | 3 refills | Status: AC

## 2023-10-08 MED ORDER — LEVETIRACETAM 750 MG PO TABS: ORAL_TABLET | ORAL | 3 refills | Status: AC

## 2023-10-08 NOTE — Patient Instructions (Addendum)
 Good to see you!  A new prescription was sent for Keppra  (Levetiracetam ) 750mg : take 2 tablets twice a day  2. Continue Lamictal  (Lamotrigine ) 100mg : take 1 tablet twice a day  3. Start a daily folic acid 1mg  supplement  4. Keep a calendar of the small episodes  5. You can call to make an appointment at the [facility]:  [phone]  [email or web address]  6. Follow-up in 6 months, call for any changes   Seizure Precautions: 1. If medication has been prescribed for you to prevent seizures, take it exactly as directed.  Do not stop taking the medicine without talking to your doctor first, even if you have not had a seizure in a long time.   2. Avoid activities in which a seizure would cause danger to yourself or to others.  Don't operate dangerous machinery, swim alone, or climb in high or dangerous places, such as on ladders, roofs, or girders.  Do not drive unless your doctor says you may.  3. If you have any warning that you may have a seizure, lay down in a safe place where you can't hurt yourself.    4.  No driving for 6 months from last seizure, as per [place]  state law.   Please refer to the following link on the Epilepsy Foundation of America's website for more information: http://www.epilepsyfoundation.org/answerplace/Social/driving/drivingu.cfm   5.  Maintain good sleep hygiene. Avoid alcohol.  6.  Notify your neurology if you are planning pregnancy or if you become pregnant.  7.  Contact your doctor if you have any problems that may be related to the medicine you are taking.  8.  Call 911 and bring the patient back to the ED if:        A.  The seizure lasts longer than 5 minutes.       B.  The patient doesn't awaken shortly after the seizure  C.  The patient has new problems such as difficulty seeing, speaking or moving  D.  The patient was injured during  the seizure  E.  The patient has a temperature over 102 F (39C)  F.  The patient vomited and now is having trouble breathing
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
## 2023-11-29 IMAGING — CT CT HEAD W/O CM
3 series · 15 of 47 positions shown, 18 images · non-contrast
Comparison: None Available.

CLINICAL DATA: New onset seizures.



[Series 3: head 5.0 h30s · axial · 0.44mm/px · z∈[-156,-16]mm · 9 of 34 slices shown, 12 images]
[im 3/34  brain]
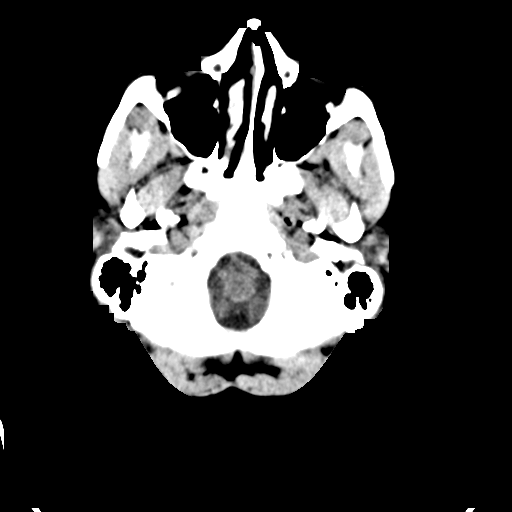
[im 3/34  bone]
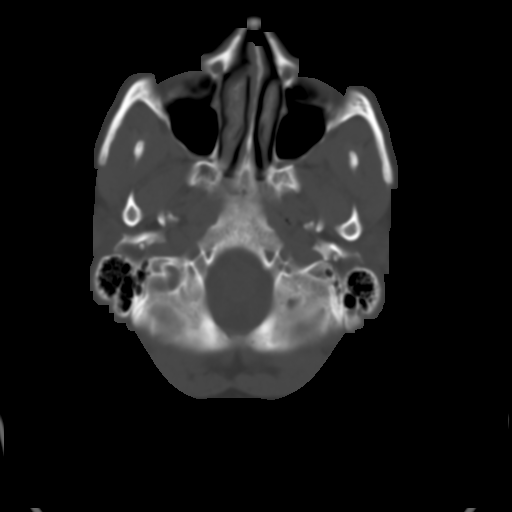
[im 6/34  brain]
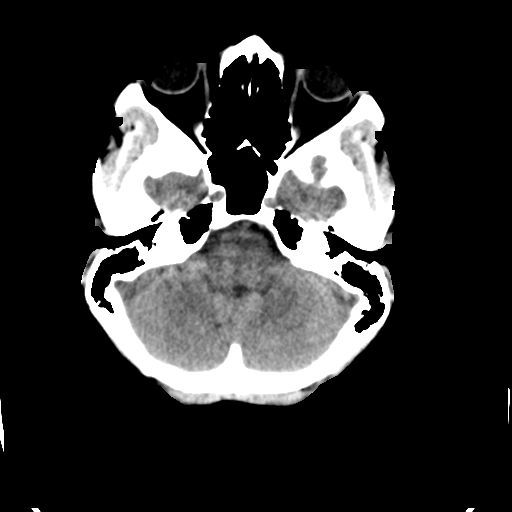
[im 10/34  brain]
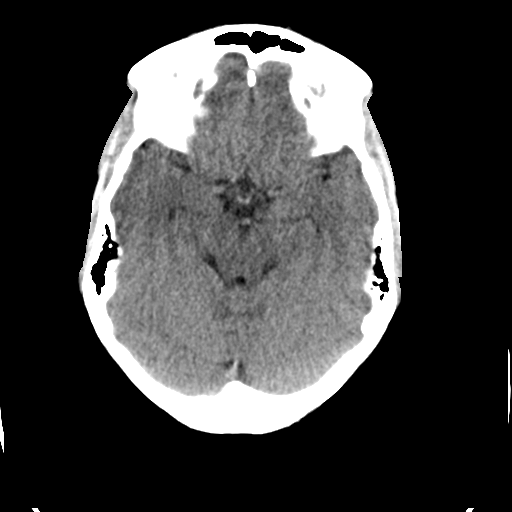
[im 13/34  brain]
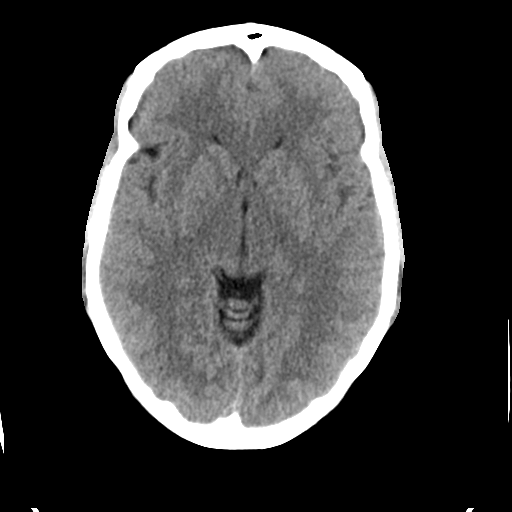
[im 18/34  brain]
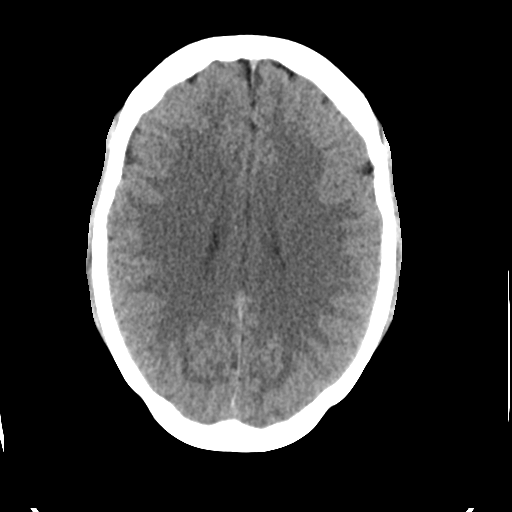
[im 18/34  bone]
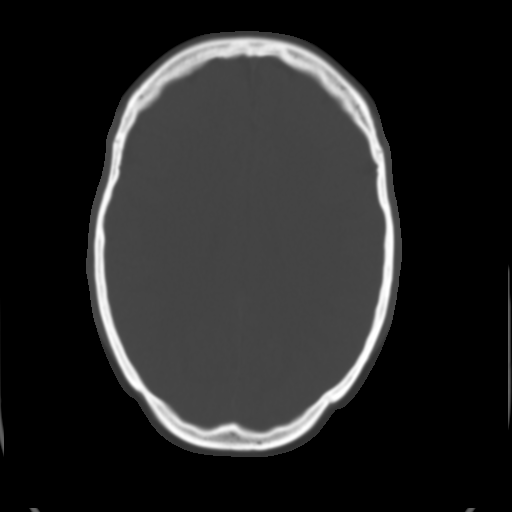
[im 21/34  brain]
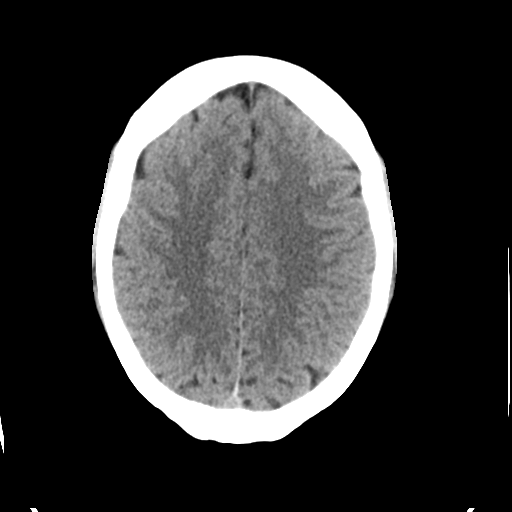
[im 24/34  brain]
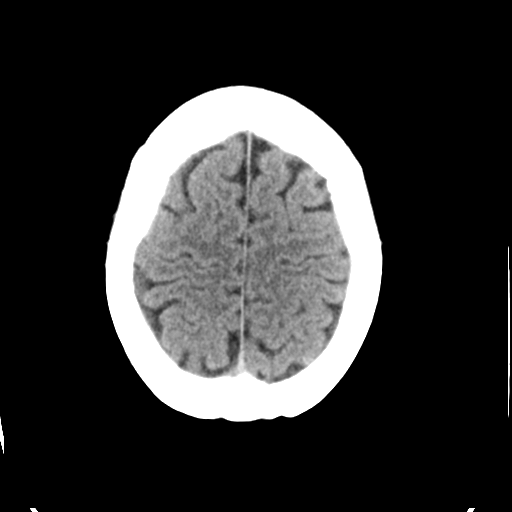
[im 28/34  brain]
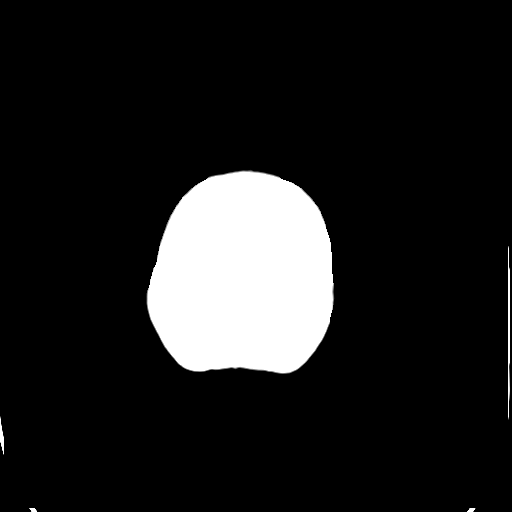
[im 31/34  brain]
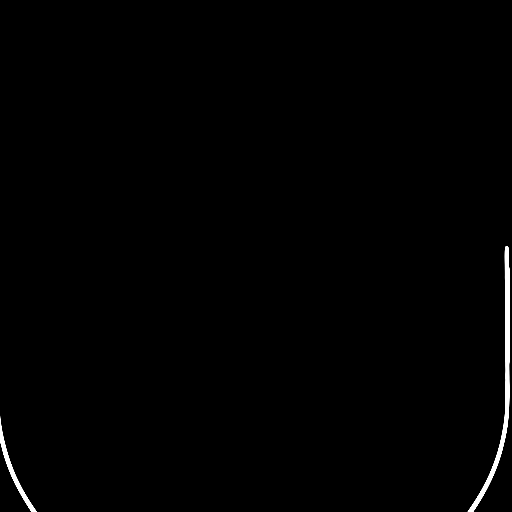
[im 31/34  bone]
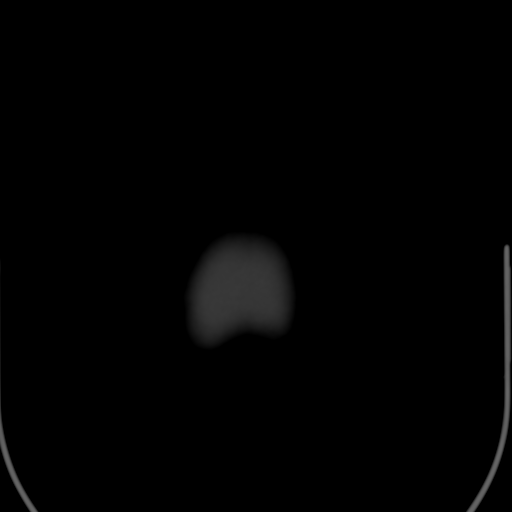

[Series 5: head 3.0 mpr cor · coronal · 0.32mm/px · 3 of 73 slices shown]
[im 25/73  brain]
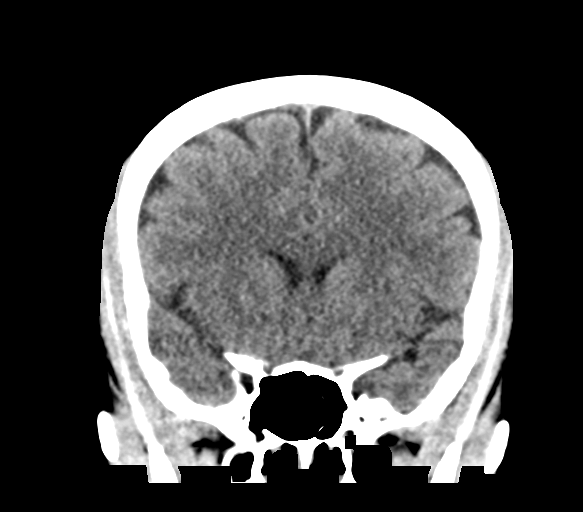
[im 33/73  brain]
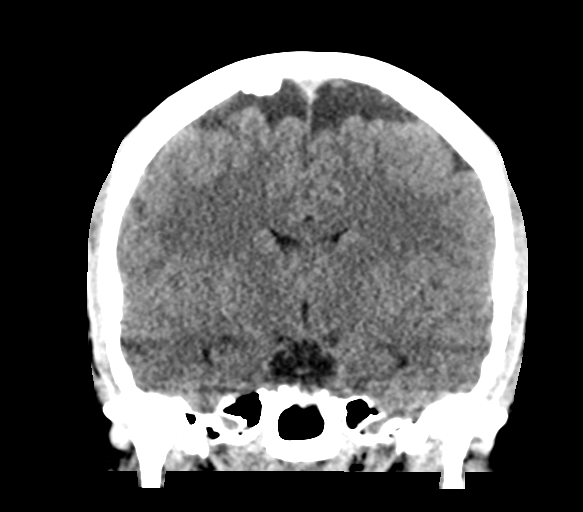
[im 41/73  brain]
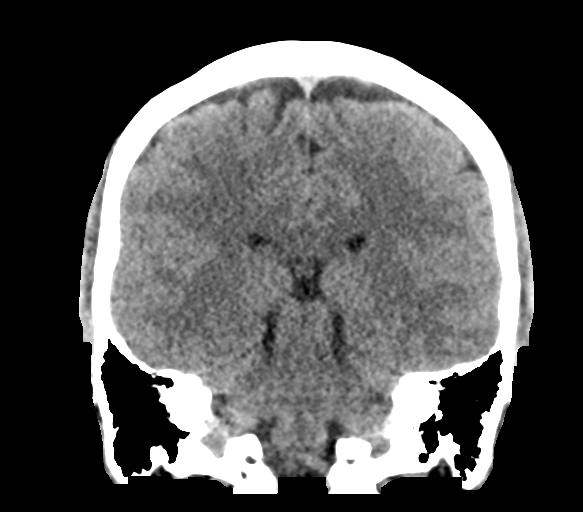

[Series 6: head 3.0 mpr sag · sagittal · 0.29mm/px · 3 of 61 slices shown]
[im 21/61  brain]
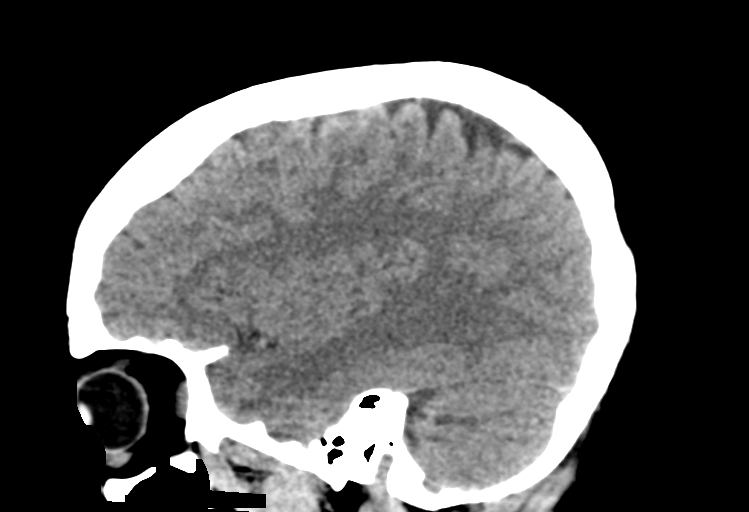
[im 31/61  brain]
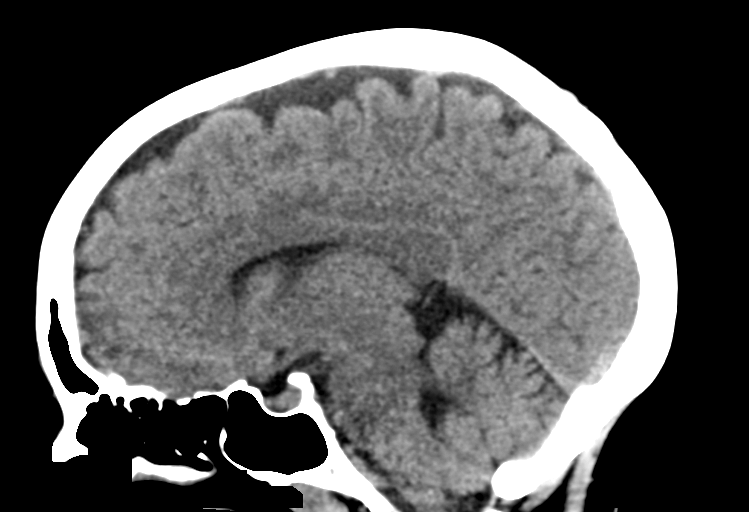
[im 41/61  brain]
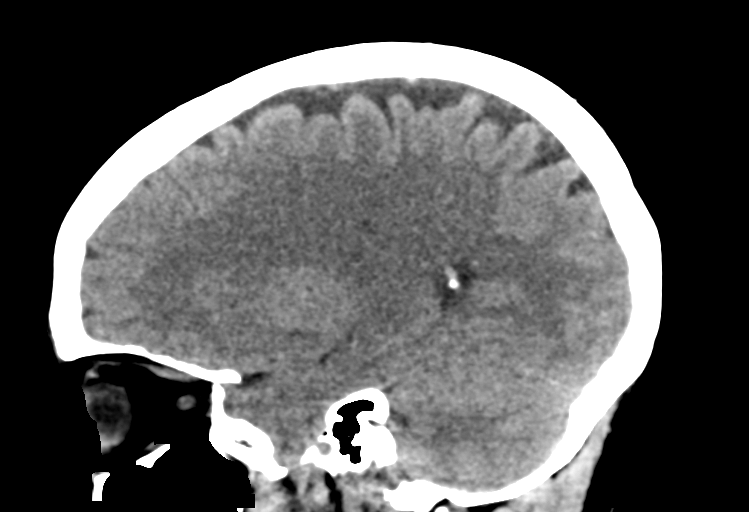

[15 of 47 positions shown; findings below may reference images not displayed]

FINDINGS: Brain: No evidence of acute infarction, hemorrhage, hydrocephalus,
extra-axial collection or mass lesion/mass effect.

Vascular: No hyperdense vessel or unexpected calcification.

Skull: Normal. Negative for fracture or focal lesion.

Sinuses/Orbits: No acute finding.

Other: None.
IMPRESSION: No acute intracranial abnormalities.

## 2023-11-29 IMAGING — DX DG FOOT COMPLETE 3+V*R*
3 series · 3 of 3 positions shown · non-contrast
Comparison: None Available.

CLINICAL DATA: Toes with abrasions and tender to palpation after
seizure.

EXAM:
RIGHT FOOT COMPLETE - 3+ VIEW

[foot ap]
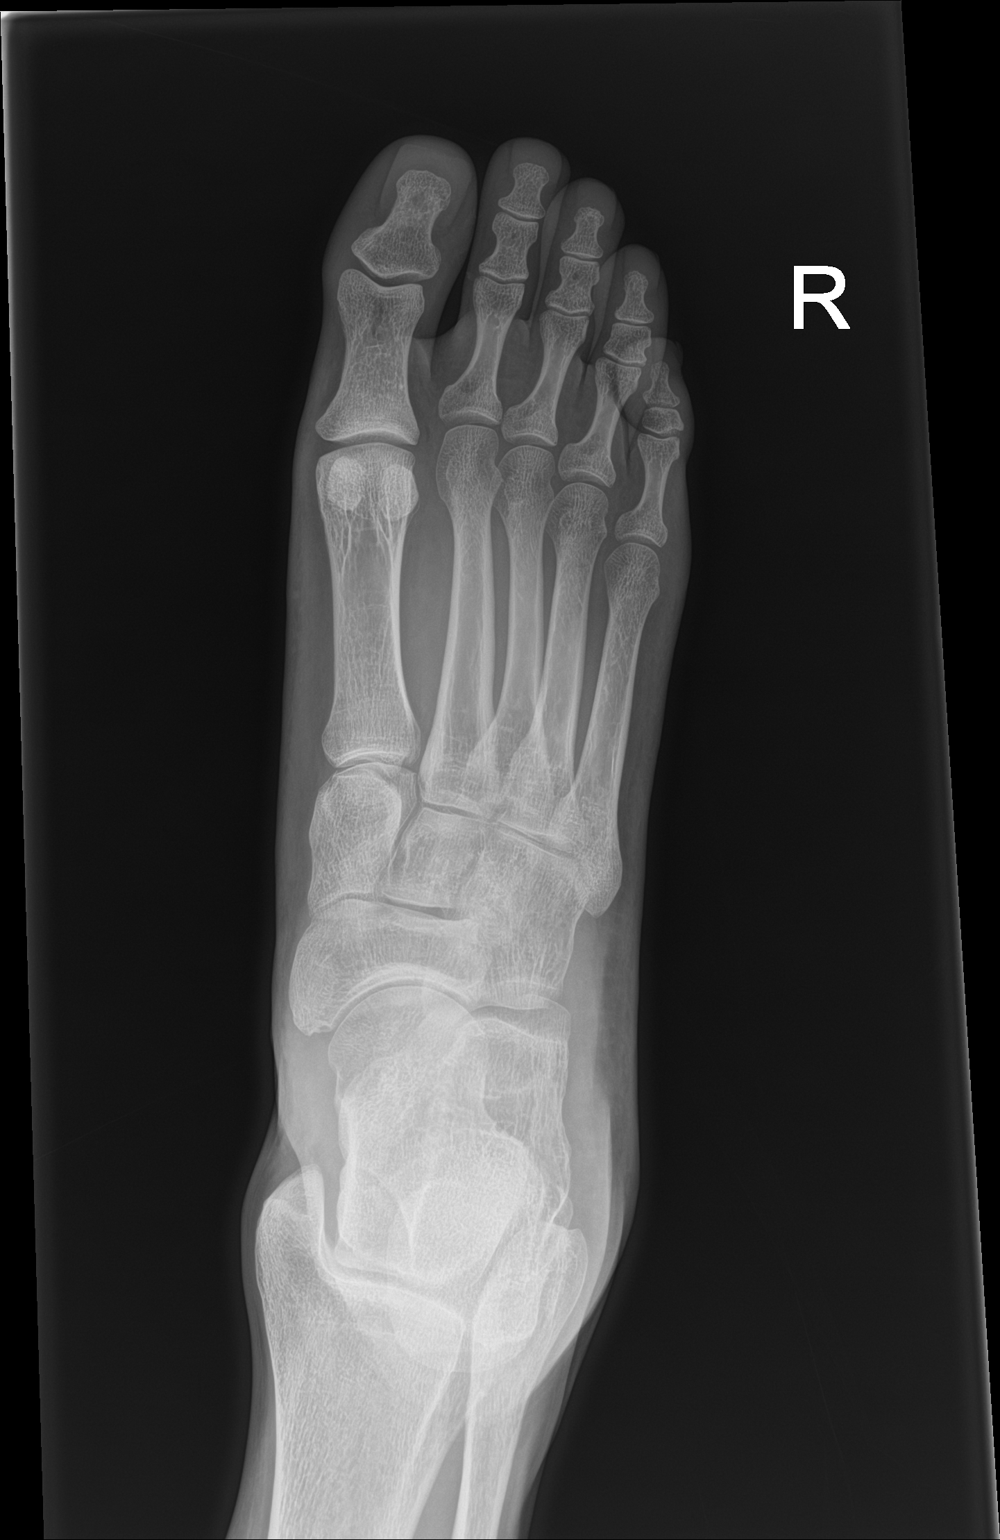

[foot obl]
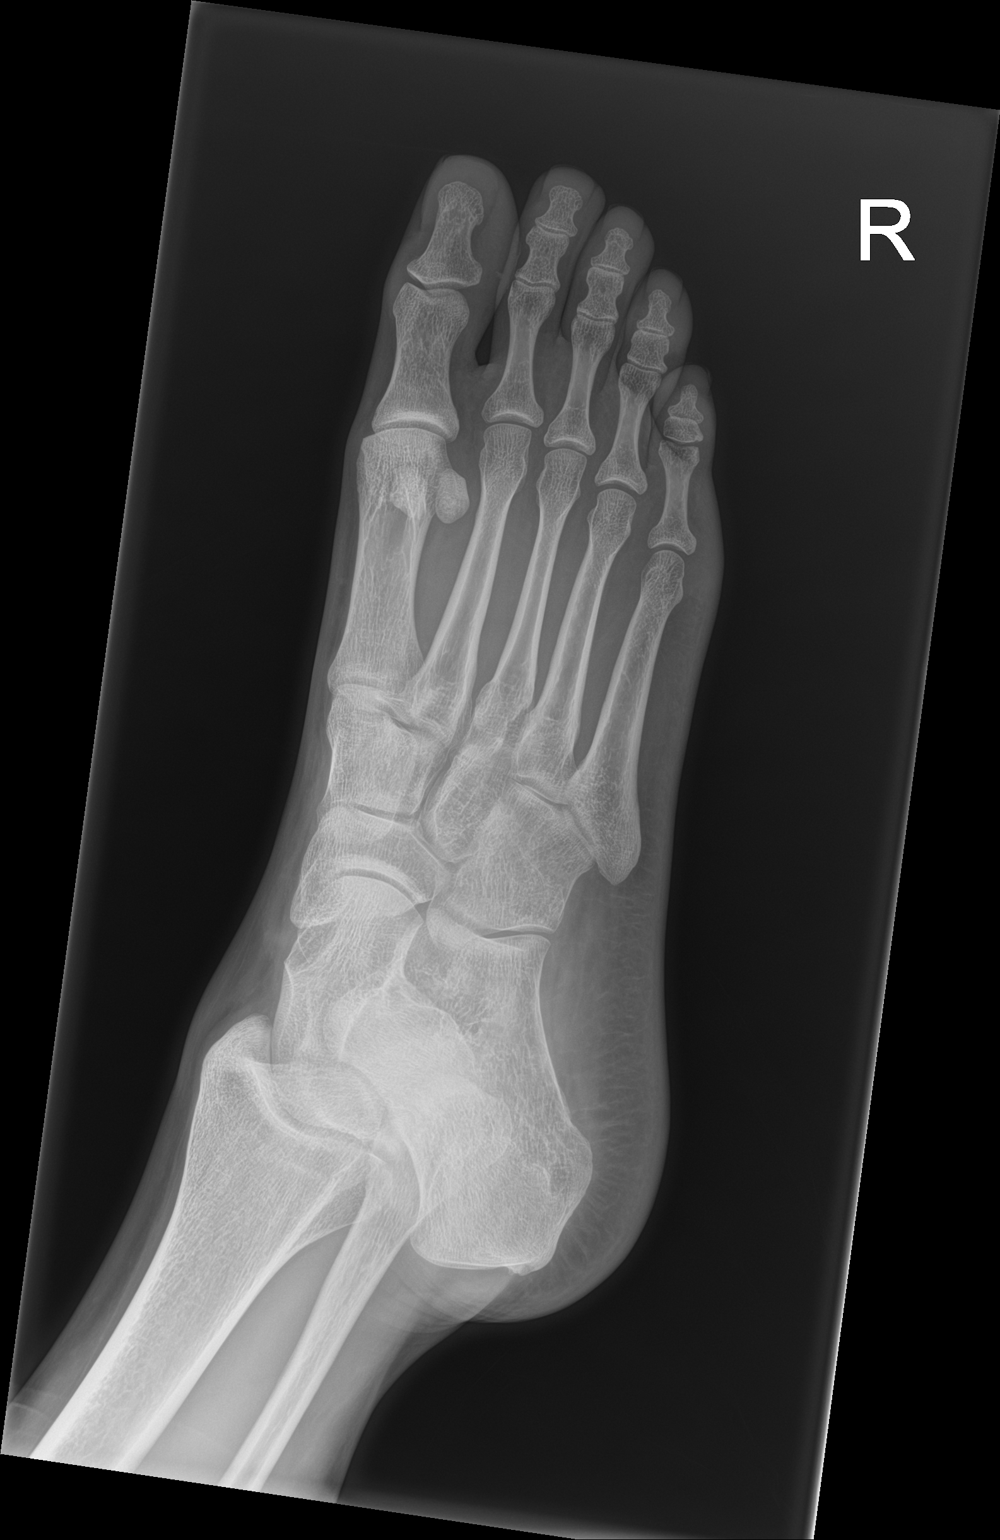

[foot lat]
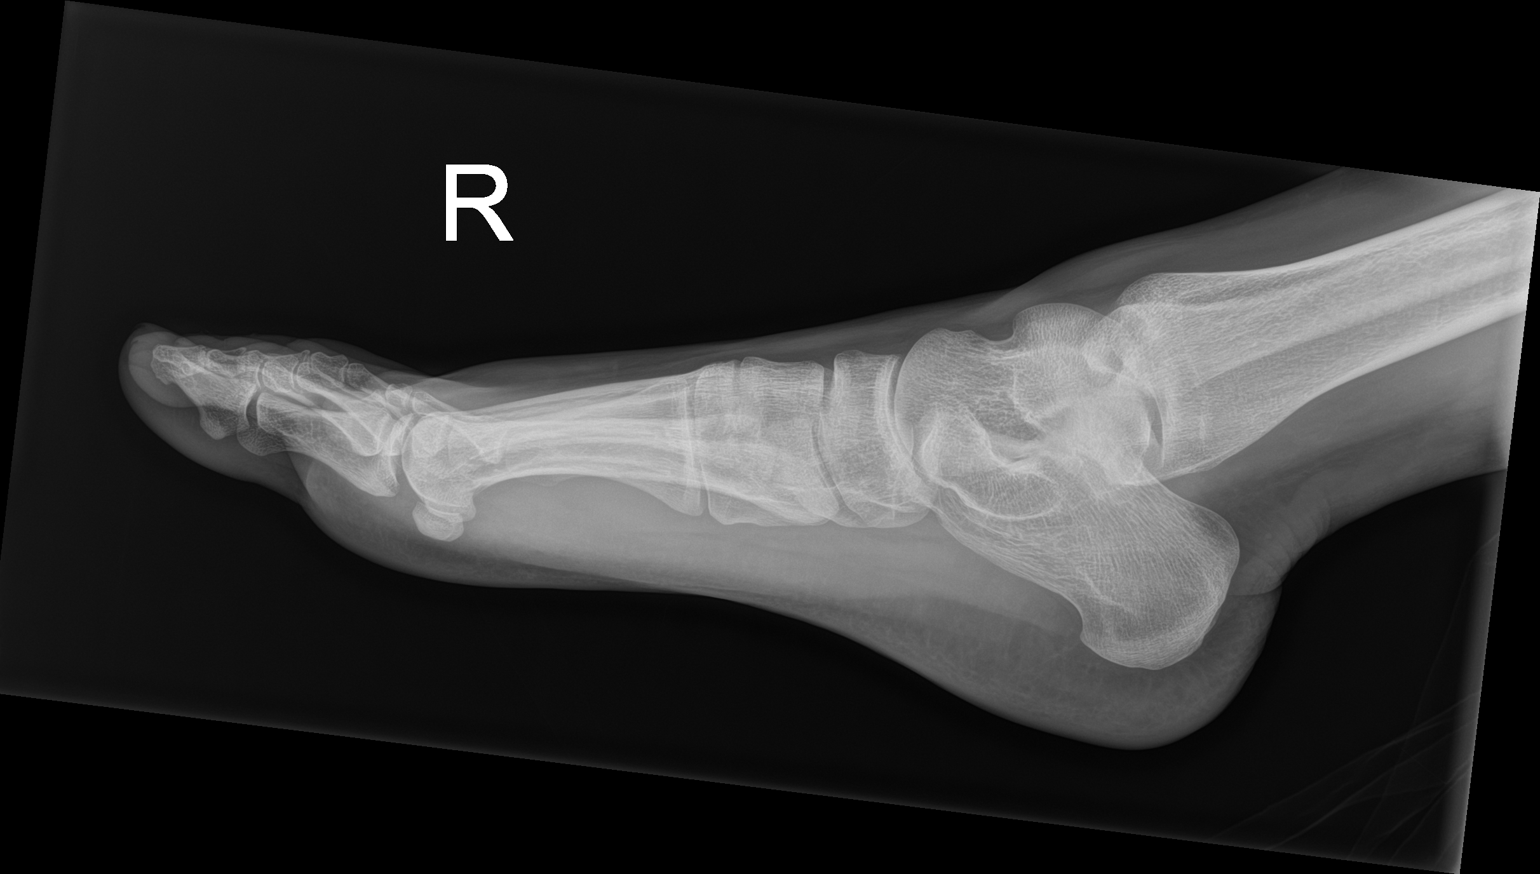

[3 of 3 positions shown; findings below may reference images not displayed]

FINDINGS: There is no evidence of fracture or dislocation. Suggestion of pes
planus on these nonweightbearing views. There is no evidence of
arthropathy or other focal bone abnormality. Soft tissues are
unremarkable.
IMPRESSION: No fracture or dislocation of the right foot.

## 2024-05-02 ENCOUNTER — Ambulatory Visit: Admitting: Neurology

## 2024-05-11 ENCOUNTER — Ambulatory Visit: Admitting: Neurology
